# Patient Record
Sex: Male | Born: 1985 | Hispanic: No | Marital: Married | Smoking: Never smoker
Health system: Southern US, Community
[De-identification: ages and names within clinical notes are randomized; demographics above are authoritative.]

## PROBLEM LIST (undated history)

## (undated) DIAGNOSIS — G4730 Sleep apnea, unspecified: Secondary | ICD-10-CM

## (undated) DIAGNOSIS — Z720 Tobacco use: Secondary | ICD-10-CM

## (undated) HISTORY — DX: Sleep apnea, unspecified: G47.30

---

## 2015-11-24 ENCOUNTER — Observation Stay (HOSPITAL_COMMUNITY): Payer: BLUE CROSS/BLUE SHIELD

## 2015-11-24 ENCOUNTER — Observation Stay (HOSPITAL_COMMUNITY)
Admission: AD | Admit: 2015-11-24 | Discharge: 2015-11-26 | Disposition: A | Discharge status: Home / Self Care | Payer: BLUE CROSS/BLUE SHIELD | Source: Ambulatory Visit | Attending: Internal Medicine | Admitting: Internal Medicine

## 2015-11-24 ENCOUNTER — Encounter (HOSPITAL_COMMUNITY): Payer: Self-pay | Admitting: Internal Medicine

## 2015-11-24 DIAGNOSIS — R197 Diarrhea, unspecified: Secondary | ICD-10-CM | POA: Diagnosis not present

## 2015-11-24 DIAGNOSIS — F17290 Nicotine dependence, other tobacco product, uncomplicated: Secondary | ICD-10-CM | POA: Insufficient documentation

## 2015-11-24 DIAGNOSIS — E669 Obesity, unspecified: Secondary | ICD-10-CM | POA: Diagnosis not present

## 2015-11-24 DIAGNOSIS — R509 Fever, unspecified: Principal | ICD-10-CM | POA: Insufficient documentation

## 2015-11-24 DIAGNOSIS — K921 Melena: Secondary | ICD-10-CM | POA: Insufficient documentation

## 2015-11-24 DIAGNOSIS — R531 Weakness: Secondary | ICD-10-CM | POA: Insufficient documentation

## 2015-11-24 DIAGNOSIS — F17200 Nicotine dependence, unspecified, uncomplicated: Secondary | ICD-10-CM | POA: Diagnosis present

## 2015-11-24 DIAGNOSIS — A09 Infectious gastroenteritis and colitis, unspecified: Secondary | ICD-10-CM | POA: Diagnosis not present

## 2015-11-24 HISTORY — DX: Tobacco use: Z72.0

## 2015-11-24 LAB — COMPREHENSIVE METABOLIC PANEL
ALBUMIN: 3.5 g/dL (ref 3.5–5.0)
ALK PHOS: 62 U/L (ref 38–126)
ALT: 33 U/L (ref 17–63)
ANION GAP: 9 (ref 5–15)
AST: 29 U/L (ref 15–41)
BILIRUBIN TOTAL: 0.4 mg/dL (ref 0.3–1.2)
BUN: 9 mg/dL (ref 6–20)
CALCIUM: 8.8 mg/dL — AB (ref 8.9–10.3)
CO2: 25 mmol/L (ref 22–32)
Chloride: 103 mmol/L (ref 101–111)
Creatinine, Ser: 1.13 mg/dL (ref 0.61–1.24)
GFR calc Af Amer: >60 mL/min (ref >60)
GLUCOSE: 100 mg/dL — AB (ref 65–99)
Potassium: 3.7 mmol/L (ref 3.5–5.1)
Sodium: 137 mmol/L (ref 135–145)
TOTAL PROTEIN: 7.8 g/dL (ref 6.5–8.1)

## 2015-11-24 LAB — URINALYSIS, ROUTINE W REFLEX MICROSCOPIC
Bilirubin Urine: NEGATIVE
GLUCOSE, UA: NEGATIVE mg/dL
KETONES UR: NEGATIVE mg/dL
LEUKOCYTES UA: NEGATIVE
Nitrite: NEGATIVE
PROTEIN: NEGATIVE mg/dL
Specific Gravity, Urine: 1.015 (ref 1.005–1.030)
pH: 5 (ref 5.0–8.0)

## 2015-11-24 LAB — CBC
HEMATOCRIT: 37.1 % — AB (ref 39.0–52.0)
HEMOGLOBIN: 12.1 g/dL — AB (ref 13.0–17.0)
MCH: 25.6 pg — ABNORMAL LOW (ref 26.0–34.0)
MCHC: 32.6 g/dL (ref 30.0–36.0)
MCV: 78.6 fL (ref 78.0–100.0)
Platelets: 268 10*3/uL (ref 150–400)
RBC: 4.72 MIL/uL (ref 4.22–5.81)
RDW: 13.9 % (ref 11.5–15.5)
WBC: 5.6 10*3/uL (ref 4.0–10.5)

## 2015-11-24 LAB — C-REACTIVE PROTEIN: CRP: 3.2 mg/dL — ABNORMAL HIGH (ref <1.0)

## 2015-11-24 LAB — C DIFFICILE QUICK SCREEN W PCR REFLEX
C Diff antigen: NEGATIVE
C Diff interpretation: NEGATIVE
C Diff toxin: NEGATIVE

## 2015-11-24 LAB — LACTIC ACID, PLASMA
LACTIC ACID, VENOUS: 1 mmol/L (ref 0.5–2.0)
Lactic Acid, Venous: 1.2 mmol/L (ref 0.5–2.0)

## 2015-11-24 LAB — TSH: TSH: 1.266 u[IU]/mL (ref 0.350–4.500)

## 2015-11-24 LAB — URINE MICROSCOPIC-ADD ON

## 2015-11-24 MED ORDER — ACETAMINOPHEN 325 MG PO TABS: 650.0000 mg | ORAL_TABLET | Freq: Four times a day (QID) | ORAL | Status: DC | PRN

## 2015-11-24 MED ORDER — TRAZODONE HCL 50 MG PO TABS: 25.0000 mg | ORAL_TABLET | Freq: Every evening | ORAL | Status: DC | PRN

## 2015-11-24 MED ORDER — HYDROCODONE-ACETAMINOPHEN 5-325 MG PO TABS: 1.0000 | ORAL_TABLET | ORAL | Status: DC | PRN

## 2015-11-24 MED ORDER — ONDANSETRON HCL 4 MG/2ML IJ SOLN: 4.0000 mg | Freq: Four times a day (QID) | INTRAMUSCULAR | Status: DC | PRN

## 2015-11-24 MED ORDER — SODIUM CHLORIDE 0.9 % IV SOLN
INTRAVENOUS | Status: DC
  Administered 2015-11-24 – 2015-11-25 (×2): via INTRAVENOUS

## 2015-11-24 MED ORDER — ACETAMINOPHEN 650 MG RE SUPP
650.0000 mg | Freq: Four times a day (QID) | RECTAL | Status: DC | PRN
Start: 2015-11-24 — End: 2015-11-26

## 2015-11-24 MED ORDER — ONDANSETRON HCL 4 MG PO TABS: 4.0000 mg | ORAL_TABLET | Freq: Four times a day (QID) | ORAL | Status: DC | PRN

## 2015-11-24 MED ORDER — BOOST / RESOURCE BREEZE PO LIQD
1.0000 | Freq: Three times a day (TID) | ORAL | Status: DC
  Administered 2015-11-24 – 2015-11-26 (×3): 1 via ORAL

## 2015-11-24 NOTE — H&P (Signed)
[facility] History and Physical  Larry BatmanBankim Consuegra ZOX:[id number] DOB: [birth year] DOA: 11/24/2015  Referring physician: Loreta Avemann PCP: Galvin ProfferHAGUE, IMRAN P, MD   Chief Complaint: persistent diarrhea/generalized weakness/fever  HPI: Larry Sanders is a very pleasant 30 y.o. male with no past medical history admitted to room 24 on [address] from gastroenterology office with chief complaint of persistent moderate loose stool with intermittent hematochezia, fever and generalized weakness.  Information is obtained from the patient reports 2 month history of persistent moderately stool. He also reports intermittent hematochezia. He denies abdominal pain nausea vomiting constipation. Over the last 2 days he developed sudden generalized weakness intermittent fever. He was referred to gastroenterology today and reportedly his temperature was 102.6 in the office. He was referred for admission. He denies headache dizziness syncope or near-syncope. He denies dysuria hematuria frequency or urgency. He denies NSAID use. He does report that he frequently has the sensation of needing to have a bowel movement and is unable to go. When he does have bowel movement is usually a small amount and very soft. Last 2 days he has had frequent watery brown stools.  Review of Systems:  Constitutional:  No weight loss, night sweats, Fevers, chills, fatigue.  HEENT:  No headaches, Difficulty swallowing,Tooth/dental problems,Sore throat,  No sneezing, itching, ear ache, nasal congestion, post nasal drip,  Cardio-vascular:  No chest pain, Orthopnea, PND, swelling in lower extremities, anasarca, dizziness, palpitations  GI:  No heartburn, indigestion, abdominal pain, nausea, vomiting, diarrhea, change in bowel habits, loss of appetite  Resp:  No shortness of breath with exertion or at rest. No excess mucus, no productive cough, No non-productive cough, No coughing up of blood.No change in color of mucus.No wheezing.No chest wall deformity    Skin:  no rash or lesions.  GU:  no dysuria, change in color of urine, no urgency or frequency. No flank pain.  Musculoskeletal:  No joint pain or swelling. No decreased range of motion. No back pain.  Psych:  No change in mood or affect. No depression or anxiety. No memory loss.   Past Medical History  Diagnosis Date  . Tobacco use     dips   No past surgical history on file. Social History:  has no tobacco, alcohol, and drug history on file.  Not on File  No family history on file. father still alive father with a history of high blood pressure. He has siblings who are in good health  Prior to Admission medications   Medication Sig Start Date End Date Taking? Authorizing Provider  Eluxadoline (VIBERZI) 100 MG TABS Take 100 mg by mouth daily.   Yes Historical Provider, MD   Physical Exam: Filed Vitals:   11/24/15 1433  BP: 139/82  Pulse: 101  Temp: 98.6 F (37 C)  TempSrc: Oral  Resp: 18  Height: 5\' 7"  (1.702 m)  Weight: 133.811 kg (295 lb)  SpO2: 99%    Wt Readings from Last 3 Encounters:  11/24/15 133.811 kg (295 lb)    General:  Appears calm and comfortable, obese Eyes: PERRL, normal lids, irises & conjunctiva ENT: grossly normal hearing, mucous membranes of his mouth are moist and pink Neck: no LAD, masses or thyromegaly Cardiovascular: RRR, no m/r/g. No LE edema. Pedal pulses present and palpable  Respiratory: CTA bilaterally, no w/r/r. Normal respiratory effort. Abdomen: soft, ntnd, obese positive bowel sounds but somewhat sluggish no guarding or rebounding Skin: no rash or induration seen on limited exam Musculoskeletal: grossly normal tone BUE/BLE Psychiatric: grossly normal mood  and affect, speech fluent and appropriate Neurologic: grossly non-focal. Speech clear facial symmetry           Labs on Admission:  Basic Metabolic Panel: No results for input(s): NA, K, CL, CO2, GLUCOSE, BUN, CREATININE, CALCIUM, MG, PHOS in the last 168 hours. Liver  Function Tests: No results for input(s): AST, ALT, ALKPHOS, BILITOT, PROT, ALBUMIN in the last 168 hours. No results for input(s): LIPASE, AMYLASE in the last 168 hours. No results for input(s): AMMONIA in the last 168 hours. CBC: No results for input(s): WBC, NEUTROABS, HGB, HCT, MCV, PLT in the last 168 hours. Cardiac Enzymes: No results for input(s): CKTOTAL, CKMB, CKMBINDEX, TROPONINI in the last 168 hours.  BNP (last 3 results) No results for input(s): BNP in the last 8760 hours.  ProBNP (last 3 results) No results for input(s): PROBNP in the last 8760 hours.  CBG: No results for input(s): GLUCAP in the last 168 hours.  Radiological Exams on Admission: No results found.  EKG:   Assessment/Plan Principal Problem:   Fever, unspecified Active Problems:   Diarrhea   Hematochezia   Generalized weakness   Obesity   Tobacco use disorder   Fever  #1. Fever. Etiology unclear. Of note patient had recent blood work and stool studies done that he reports were "negative" -Admit to MedSurg -Obtain urinalysis chest x-ray -Stool studies -CBC -  #2. Persistent/worsening diarrhea. Etiology unclear. -Stool studies -KUB -GI consult. -FOBT  3. Generalized weakness. May be related to above. -Obtain complete metabolic panel -CBC -IV fluids  #4. Tobacco use. He uses snuff -Cessation counseling offered  #5. Obesity. BMI greater than 30. -Nutritional consult   Reportedly Dr Loreta Ave will see Code Status: full DVT Prophylaxis: Family Communication: wife at bedside Disposition Plan: home when ready  Time spent: 29  [facility] M [facility]

## 2015-11-24 NOTE — Progress Notes (Signed)
Pt admitted directly with c/o loose stools, abdo pain, poor appetite and generalized weakness. Reports that he has not eaten any meal in 3 days. Stool specimen ordered and still to be collected. Pt is alert, oriented and able to voice needs. No pain voiced at this time

## 2015-11-24 NOTE — Consult Note (Signed)
Reason for Consult: Diarrhea, fever Referring Physician: [facility]  Brandol Celine MansDesai HPI: This is a 30 year old male with a 3 month history of 20-25 bloody bowel movements with mucus.  Over the time course his symptoms have worsened and over the past three days he has watery bloody diarrhea.  No complaints of abdominal pain.  Initially he presented to [facility] under an urgent care setting.  At that time he was just having mucus.  A week or so later he started having hematochezia with his mucus and he represented back to [facility] and, per his report, further work up was performed with stool studies and blood work.  The results were normal and he was encouraged to seek a PCP.  He [place]. [place]ran [place] as he is his parents PCP and his first appointment was 3 weeks ago.  He was started on Viberzi, a medication for IBS, but it was not beneficial.  In fact he felt that it may him worse.  Additionally, he was treated with doxycycline from [facility] for his complaints, but it was not of any benefit.  There is no history of IBD in the family and he denies any prior issues before this time.  The patient started having fever up to 102 a couple of days ago.  Past Medical History  Diagnosis Date  . Tobacco use     dips    No past surgical history on file.  No family history on file.  Social History:  has no tobacco, alcohol, and drug history on file.  Allergies: Not on File  Medications:  Scheduled: . feeding supplement  1 Container Oral TID BM   Continuous: . sodium chloride      No results found for this or any previous visit (from the past 24 hour(s)).   No results found.  ROS:  As stated above in the HPI otherwise negative.  Blood pressure 139/82, pulse 101, temperature 98.6 F (37 C), temperature source Oral, resp. rate 18, height 5\' 7"  (1.702 m), weight 133.811 kg (295 lb), SpO2 99 %.    PE: Gen: NAD, Alert and Oriented HEENT:  [place]/AT, EOMI Neck:  Supple, no LAD Lungs: CTA Bilaterally CV: RRR without M/G/R ABM: Soft, NTND, obese, +BS Ext: No C/C/E  Assessment/Plan: 1) Hematochezia. 2) Diarrhea.   Given the severity of his symptoms he appears to be surprisingly well.  No complaints of any abdominal pain.  I spoke with Dr. Loreta AveMann who saw him in the office today and recommended a hospital admission.  She reports that no work up was performed.  My suspicion is that he has IBD, but an infectious etiology needs to be evaluated.    Plan: 1) IV hydration. 2) Check for C. Diff. 3) Basic blood work. 4) Pending the results of the C. Diff work up a colonoscopy may or may need be performed.  Othell Jaime D 11/24/2015, 4:36 PM

## 2015-11-25 DIAGNOSIS — E669 Obesity, unspecified: Secondary | ICD-10-CM

## 2015-11-25 DIAGNOSIS — R531 Weakness: Secondary | ICD-10-CM | POA: Diagnosis not present

## 2015-11-25 DIAGNOSIS — K921 Melena: Secondary | ICD-10-CM

## 2015-11-25 DIAGNOSIS — A09 Infectious gastroenteritis and colitis, unspecified: Secondary | ICD-10-CM

## 2015-11-25 DIAGNOSIS — R509 Fever, unspecified: Secondary | ICD-10-CM | POA: Diagnosis not present

## 2015-11-25 LAB — CBC
HEMATOCRIT: 39.1 % (ref 39.0–52.0)
Hemoglobin: 12.4 g/dL — ABNORMAL LOW (ref 13.0–17.0)
MCH: 24.8 pg — ABNORMAL LOW (ref 26.0–34.0)
MCHC: 31.7 g/dL (ref 30.0–36.0)
MCV: 78.4 fL (ref 78.0–100.0)
PLATELETS: 260 10*3/uL (ref 150–400)
RBC: 4.99 MIL/uL (ref 4.22–5.81)
RDW: 14 % (ref 11.5–15.5)
WBC: 5.3 10*3/uL (ref 4.0–10.5)

## 2015-11-25 LAB — GASTROINTESTINAL PANEL BY PCR, STOOL (REPLACES STOOL CULTURE)
ASTROVIRUS: NOT DETECTED
Adenovirus F40/41: NOT DETECTED
CAMPYLOBACTER SPECIES: NOT DETECTED
Cryptosporidium: NOT DETECTED
Cyclospora cayetanensis: NOT DETECTED
E. COLI O157: NOT DETECTED
ENTEROTOXIGENIC E COLI (ETEC): NOT DETECTED
Entamoeba histolytica: NOT DETECTED
Enteroaggregative E coli (EAEC): DETECTED — AB
Enteropathogenic E coli (EPEC): NOT DETECTED
Giardia lamblia: NOT DETECTED
NOROVIRUS GI/GII: NOT DETECTED
PLESIMONAS SHIGELLOIDES: NOT DETECTED
Rotavirus A: NOT DETECTED
SALMONELLA SPECIES: NOT DETECTED
SAPOVIRUS (I, II, IV, AND V): NOT DETECTED
SHIGA LIKE TOXIN PRODUCING E COLI (STEC): NOT DETECTED
SHIGELLA/ENTEROINVASIVE E COLI (EIEC): NOT DETECTED
Vibrio cholerae: NOT DETECTED
Vibrio species: NOT DETECTED
Yersinia enterocolitica: NOT DETECTED

## 2015-11-25 LAB — BASIC METABOLIC PANEL
Anion gap: 10 (ref 5–15)
BUN: 7 mg/dL (ref 6–20)
CHLORIDE: 104 mmol/L (ref 101–111)
CO2: 23 mmol/L (ref 22–32)
CREATININE: 1.02 mg/dL (ref 0.61–1.24)
Calcium: 8.5 mg/dL — ABNORMAL LOW (ref 8.9–10.3)
GFR calc Af Amer: >60 mL/min (ref >60)
GLUCOSE: 103 mg/dL — AB (ref 65–99)
POTASSIUM: 3.3 mmol/L — AB (ref 3.5–5.1)
SODIUM: 137 mmol/L (ref 135–145)

## 2015-11-25 MED ORDER — POTASSIUM CHLORIDE CRYS ER 20 MEQ PO TBCR
40.0000 meq | EXTENDED_RELEASE_TABLET | Freq: Once | ORAL | Status: AC
  Administered 2015-11-25: 40 meq via ORAL
  Filled 2015-11-25: qty 2

## 2015-11-25 MED ORDER — CIPROFLOXACIN HCL 500 MG PO TABS
500.0000 mg | ORAL_TABLET | Freq: Two times a day (BID) | ORAL | Status: DC
  Administered 2015-11-25 – 2015-11-26 (×2): 500 mg via ORAL
  Filled 2015-11-25 (×2): qty 1

## 2015-11-25 NOTE — Progress Notes (Addendum)
PROGRESS NOTE  Larry Sanders [id number] DOB: 1986-06-25 DOA: 11/24/2015 PCP: Galvin ProfferHAGUE, IMRAN P, MD  Assessment/Plan: Fever. Etiology unclear. Of note patient had recent blood work and stool studies done that he reports were "negative" -Admit to MedSurg -Stool studies pending -no WBCs   Persistent/worsening diarrhea. Etiology unclear. -Stool studies- show EAEC-- will plan to treat for 5 days of PO cipro -GI consult as they sent in from office  -HIV check  Generalized weakness. May be related to above. -Obtain complete metabolic panel -CBC -IV fluids  Tobacco use. He uses snuff -Cessation counseling offered  Obesity. BMI greater than 30. -Nutritional consult  Code Status: full Family Communication: patient Disposition Plan:    Consultants:  GI  Procedures:      HPI/Subjective: Still having diarrhea  Objective: Filed Vitals:   11/24/15 2225 11/25/15 0523  BP: 120/64 125/81  Pulse: 98 79  Temp: 99.4 F (37.4 C) 98 F (36.7 C)  Resp: 18 18   No intake or output data in the 24 hours ending 11/25/15 0827 Filed Weights   11/24/15 1433  Weight: 133.811 kg (295 lb)    Exam:   General:  Awake, NAD  Cardiovascular: rrr  Respiratory: clear  Abdomen: +BS, soft  Musculoskeletal: no edema   Data Reviewed: Basic Metabolic Panel:  Recent Labs Lab 11/24/15 1550 11/25/15 0341  NA 137 137  K 3.7 3.3*  CL 103 104  CO2 25 23  GLUCOSE 100* 103*  BUN 9 7  CREATININE 1.13 1.02  CALCIUM 8.8* 8.5*   Liver Function Tests:  Recent Labs Lab 11/24/15 1550  AST 29  ALT 33  ALKPHOS 62  BILITOT 0.4  PROT 7.8  ALBUMIN 3.5   No results for input(s): LIPASE, AMYLASE in the last 168 hours. No results for input(s): AMMONIA in the last 168 hours. CBC:  Recent Labs Lab 11/24/15 1550 11/25/15 0341  WBC 5.6 5.3  HGB 12.1* 12.4*  HCT 37.1* 39.1  MCV 78.6 78.4  PLT 268 260   Cardiac Enzymes: No results for input(s): CKTOTAL, CKMB, CKMBINDEX,  TROPONINI in the last 168 hours. BNP (last 3 results) No results for input(s): BNP in the last 8760 hours.  ProBNP (last 3 results) No results for input(s): PROBNP in the last 8760 hours.  CBG: No results for input(s): GLUCAP in the last 168 hours.  Recent Results (from the past 240 hour(s))  C difficile quick scan w PCR reflex     Status: None   Collection Time: 11/24/15  3:07 PM  Result Value Ref Range Status   C Diff antigen NEGATIVE NEGATIVE Final   C Diff toxin NEGATIVE NEGATIVE Final   C Diff interpretation Negative for toxigenic C. difficile  Final     Studies: Dg Chest 2 View  11/24/2015  CLINICAL DATA:  30 year old male with a history of fever EXAM: CHEST - 2 VIEW COMPARISON:  None. FINDINGS: Cardiomediastinal silhouette within normal limits. Lung volumes are low. Proud interstitial markings in vasculature bilaterally. No confluent airspace disease, pneumothorax, or pleural effusion. No displaced fracture. Unremarkable appearance of the upper abdomen. IMPRESSION: Low lung volumes with no evidence of lobar pneumonia. Signed, Yvone NeuJaime S. Loreta AveWagner, DO [facility] Electronically Signed   By: Gilmer MorJaime  Wagner D.O.   On: 11/24/2015 18:16   Dg Abd 1 View  11/24/2015  CLINICAL DATA:  30 year old male with a history of fever EXAM: ABDOMEN - 1 VIEW COMPARISON:  None. FINDINGS: Gas within stomach, small bowel, and colon.  Upright image demonstrates air-fluid levels of the right and left abdomen. No abnormally distended small bowel or colon. No radiopaque foreign body. No unexpected calcifications. No displaced fracture. IMPRESSION: Multiple air-fluid levels on the upright image, without evidence of obstruction. Findings are nonspecific though potentially could represent enteritis. Signed, Yvone Neu. Loreta Ave, DO [facility] Electronically Signed   By: Gilmer Mor D.O.   On: 11/24/2015 18:17      Scheduled Meds: . feeding supplement  1 Container Oral TID BM   Continuous Infusions: . sodium chloride 50 mL/hr at 11/24/15 1815   Antibiotics Given (last 72 hours)    None      Principal Problem:   Fever, unspecified Active Problems:   Diarrhea   Hematochezia   Generalized weakness   Obesity   Tobacco use disorder   Fever    Time spent: 35 min    Malyiah Fellows [facility]  [facility] Pager [phone]. If 7PM-7AM, please contact night-coverage at [email or web address], password [facility] 11/25/2015, 8:27 AM

## 2015-11-25 NOTE — Progress Notes (Signed)
Subjective: Since I last evaluated the patient, he seems to be doing much better. He has had 4 small volume BM's today. He denies having any abdominal pain, nausea or vomiting. Anxious about going home. 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     Objective: Vital signs in last 24 hours: Temp:  [98 F (36.7 C)-99.4 F (37.4 C)] 98.6 F (37 C) (01/04 1447) Pulse Rate:  [79-98] 79 (01/04 1447) Resp:  [16-18] 16 (01/04 1447) BP: (120-131)/(64-81) 131/77 mmHg (01/04 1447) SpO2:  [96 %-100 %] 100 % (01/04 1447) Last BM Date: 11/24/15   General appearance: alert, cooperative, appears older than stated age and no distress Resp: clear to auscultation bilaterally Cardio: regular rate and rhythm, S1, S2 normal, no murmur, click, rub or gallop GI: soft, non-tender; bowel sounds normal; no masses,  no organomegaly Extremities: extremities normal, atraumatic, no cyanosis or edema  Lab Results:  Recent Labs  11/24/15 1550 11/25/15 0341  WBC 5.6 5.3  HGB 12.1* 12.4*  HCT 37.1* 39.1  PLT 268 260   BMET  Recent Labs  11/24/15 1550 11/25/15 0341  NA 137 137  K 3.7 3.3*  CL 103 104  CO2 25 23  GLUCOSE 100* 103*  BUN 9 7  CREATININE 1.13 1.02  CALCIUM 8.8* 8.5*   LFT  Recent Labs  11/24/15 1550  PROT 7.8  ALBUMIN 3.5  AST 29  ALT 33  ALKPHOS 62  BILITOT 0.4    Recent Labs  11/24/15 1507  CDIFFTOX NEGATIVE   Studies/Results: Dg Chest 2 View  11/24/2015  CLINICAL DATA:  30 year old male with a history of fever EXAM: CHEST - 2 VIEW COMPARISON:  None. FINDINGS: Cardiomediastinal silhouette within normal limits. Lung volumes are low. Proud interstitial markings in vasculature bilaterally. No confluent airspace disease, pneumothorax, or pleural effusion. No displaced fracture. Unremarkable appearance of the upper abdomen. IMPRESSION: Low lung volumes with no evidence of lobar pneumonia. Signed, Yvone NeuJaime S. Loreta AveWagner, DO [facility] Electronically Signed   By: Gilmer MorJaime  Wagner D.O.   On: 11/24/2015 18:16   Dg Abd 1 View  11/24/2015  CLINICAL DATA:  30 year old male with a history of fever EXAM: ABDOMEN - 1 VIEW COMPARISON:  None. FINDINGS: Gas within stomach, small bowel, and colon. Upright image demonstrates air-fluid levels of the right and left abdomen. No abnormally distended small bowel or colon. No radiopaque foreign body. No unexpected calcifications. No displaced fracture. IMPRESSION: Multiple air-fluid levels on the upright image, without evidence of obstruction. Findings are nonspecific though potentially could represent enteritis. Signed, Yvone NeuJaime S. Loreta AveWagner, DO [facility] Electronically Signed   By: Gilmer MorJaime  Wagner D.O.   On: 11/24/2015 18:17   Medications: I have reviewed the patient's current medications.  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 Continue present care. Advance diet as tolerated.    Onesty Clair 11/25/2015, 6:02 PM

## 2015-11-25 NOTE — Progress Notes (Signed)
Initial Nutrition Assessment  DOCUMENTATION CODES:   Morbid obesity  INTERVENTION:   -RD will follow for diet advancement and supplement diet as appropriate  NUTRITION DIAGNOSIS:   Inadequate oral intake related to inability to eat as evidenced by NPO status.  GOAL:   Patient will meet greater than or equal to 90% of their needs  MONITOR:   Diet advancement, Labs, Weight trends, Skin, I & O's  REASON FOR ASSESSMENT:   Malnutrition Screening Tool    ASSESSMENT:   Larry Sanders is a 30 y.o. male with a Past Medical History of chewing tobacco who presents with abd pain from PCP as direct admission  Pt admitted with hematochezia.   Spoke with RN, who reports pt was consuming water earlier daily without difficulty. She confirmed NPO status.  Hx obtained from pt, who was sitting in the recliner at time of visit. He reports he did not consume anything for the past 3 days due to diarrhea and abdominal pain. Prior to this, he reports that his appetite has been fair over the past 3 months, however, was not at baseline. PTA, pt generally consumed 1 large meal daily, consisting of chicken and rice.   Pt reports UBW is around 305# and endorses a 10# wt loss over the past 2 months (weight change not significant for time frame). 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 He reports he required to use the restroom 20-25 times daily on average.   Nutrition-Focused physical exam completed. Findings are no fat depletion, no muscle depletion, and no edema.   Pt understanding of NPO order and reports he does not feel like eating currently.  Labs reviewed: K: 3.3.   Diet Order:  Diet NPO time specified  Skin:  Reviewed, no issues  Last BM:  11/24/15  Height:   Ht Readings from Last 1 Encounters:  11/24/15 5\' 7"  (1.702 m)    Weight:   Wt Readings from Last 1 Encounters:  11/24/15 295 lb (133.811  kg)    Ideal Body Weight:  67.2 kg  BMI:  Body mass index is 46.19 kg/(m^2).  Estimated Nutritional Needs:   Kcal:  1900-2100  Protein:  95-105 grams  Fluid:  1.9-2.1 L  EDUCATION NEEDS:   No education needs identified at this time  Hennie Gosa A. Mayford KnifeWilliams, RD, LDN, CDE Pager: [phone] After hours Pager: [phone]

## 2015-11-25 NOTE — Progress Notes (Signed)
CRITICAL VALUE ALERT  Critical value received:  Stool specimen positive for EAEC(e.coli)  Date of notification:  11/25/15  Time of notification:  1445  Critical value read back:Yes.    Nurse who received alert:  Leota Jacobsenara Leeland Lovelady  MD notified (1st page):  Verner MouldVann, J  Time of first page:  1515  MD notified (2nd page):  Time of second page:  Responding MD:  Benjamine MolaVann. J  Time MD responded:  1516

## 2015-11-26 DIAGNOSIS — K921 Melena: Secondary | ICD-10-CM | POA: Diagnosis not present

## 2015-11-26 DIAGNOSIS — R531 Weakness: Secondary | ICD-10-CM | POA: Diagnosis not present

## 2015-11-26 DIAGNOSIS — A09 Infectious gastroenteritis and colitis, unspecified: Secondary | ICD-10-CM | POA: Diagnosis not present

## 2015-11-26 DIAGNOSIS — R509 Fever, unspecified: Secondary | ICD-10-CM | POA: Diagnosis not present

## 2015-11-26 LAB — HIV ANTIBODY (ROUTINE TESTING W REFLEX): HIV Screen 4th Generation wRfx: NONREACTIVE

## 2015-11-26 MED ORDER — CIPROFLOXACIN HCL 500 MG PO TABS: 500.0000 mg | ORAL_TABLET | Freq: Two times a day (BID) | ORAL | Status: DC

## 2015-11-26 NOTE — Progress Notes (Addendum)
According to the patient, he had 9 episodes of small loose green stools with the last one seen by this nurse a few minutes ago.  Patient denies any pain and had drink apple juice, water and veggie broth during my shift.

## 2015-11-26 NOTE — Progress Notes (Signed)
Subjective: 9 watery bowel movements.  No significant change.  Objective: Vital signs in last 24 hours: Temp:  [98.3 F (36.8 C)-98.6 F (37 C)] 98.6 F (37 C) (01/05 0542) Pulse Rate:  [79-86] 84 (01/05 0542) Resp:  [16-17] 17 (01/05 0542) BP: (104-131)/(58-77) 111/66 mmHg (01/05 0542) SpO2:  [100 %] 100 % (01/05 0542) Last BM Date: 11/26/15  Intake/Output from previous day: 01/04 0701 - 01/05 0700 In: 1727 [P.O.:1077; I.V.:650] Out: -  Intake/Output this shift:    General appearance: alert and no distress GI: soft, non-tender; bowel sounds normal; no masses,  no organomegaly  Lab Results:  Recent Labs  11/24/15 1550 11/25/15 0341  WBC 5.6 5.3  HGB 12.1* 12.4*  HCT 37.1* 39.1  PLT 268 260   BMET  Recent Labs  11/24/15 1550 11/25/15 0341  NA 137 137  K 3.7 3.3*  CL 103 104  CO2 25 23  GLUCOSE 100* 103*  BUN 9 7  CREATININE 1.13 1.02  CALCIUM 8.8* 8.5*   LFT  Recent Labs  11/24/15 1550  PROT 7.8  ALBUMIN 3.5  AST 29  ALT 33  ALKPHOS 62  BILITOT 0.4   PT/INR No results for input(s): LABPROT, INR in the last 72 hours. Hepatitis Panel No results for input(s): HEPBSAG, HCVAB, HEPAIGM, HEPBIGM in the last 72 hours. C-Diff  Recent Labs  11/24/15 1507  CDIFFTOX NEGATIVE   Fecal Lactopherrin No results for input(s): FECLLACTOFRN in the last 72 hours.  Studies/Results: Dg Chest 2 View  11/24/2015  CLINICAL DATA:  30 year old male with a history of fever EXAM: CHEST - 2 VIEW COMPARISON:  None. FINDINGS: Cardiomediastinal silhouette within normal limits. Lung volumes are low. Proud interstitial markings in vasculature bilaterally. No confluent airspace disease, pneumothorax, or pleural effusion. No displaced fracture. Unremarkable appearance of the upper abdomen. IMPRESSION: Low lung volumes with no evidence of lobar pneumonia. Signed, Yvone NeuJaime S. Loreta AveWagner, DO [facility] Electronically Signed    By: Gilmer MorJaime  Wagner D.O.   On: 11/24/2015 18:16   Dg Abd 1 View  11/24/2015  CLINICAL DATA:  30 year old male with a history of fever EXAM: ABDOMEN - 1 VIEW COMPARISON:  None. FINDINGS: Gas within stomach, small bowel, and colon. Upright image demonstrates air-fluid levels of the right and left abdomen. No abnormally distended small bowel or colon. No radiopaque foreign body. No unexpected calcifications. No displaced fracture. IMPRESSION: Multiple air-fluid levels on the upright image, without evidence of obstruction. Findings are nonspecific though potentially could represent enteritis. Signed, Yvone NeuJaime S. Loreta AveWagner, DO [facility] Electronically Signed   By: Gilmer MorJaime  Wagner D.O.   On: 11/24/2015 18:17    Medications:  Scheduled: . ciprofloxacin  500 mg Oral BID  . feeding supplement  1 Container Oral TID BM   Continuous: . sodium chloride 50 mL/hr at 11/25/15 1529    Assessment/Plan: 1) EAEC. 2) Diarrhea.   The patiently received one dose of Cipro.  Too soon to tell about the benefit.  Still with significant diarrhea.  Plan: 1) Continue with Cipro. 2) Continue with IV hydration.     Ezri Landers D 11/26/2015, 8:52 AM

## 2015-11-26 NOTE — Discharge Summary (Signed)
Physician Discharge Summary  [name] ZOX:[id number] DOB: 25-Nov-1985 DOA: 11/24/2015  PCP: Galvin Proffer, MD  Admit date: 11/24/2015 Discharge date: 11/26/2015  Time spent: 35 minutes  Recommendations for Outpatient Follow-up:  1. Return to GI- Dr. Loreta Ave if diarrhea persists    Discharge Diagnoses:  Principal Problem:   Fever, unspecified Active Problems:   Diarrhea   Hematochezia   Generalized weakness   Obesity   Tobacco use disorder   Fever   Discharge Condition: improved  Diet recommendation: as tolerated  Filed Weights   11/24/15 1433  Weight: 133.811 kg (295 lb)    History of present illness:  Larry Sanders is a very pleasant 30 y.o. male with no past medical history admitted to room 24 on 20 N. from gastroenterology office with chief complaint of persistent moderate loose stool with intermittent hematochezia, fever and generalized weakness.  Information is obtained from the patient reports 2 month history of persistent moderately stool. He also reports intermittent hematochezia. He denies abdominal pain nausea vomiting constipation. Over the last 2 days he developed sudden generalized weakness intermittent fever. He was referred to gastroenterology today and reportedly his temperature was 102.6 in the office. He was referred for admission. He denies headache dizziness syncope or near-syncope. He denies dysuria hematuria frequency or urgency. He denies NSAID use. He does report that he frequently has the sensation of needing to have a bowel movement and is unable to go. When he does have bowel movement is usually a small amount and very soft. Last 2 days he has had frequent watery brown stools.  Hospital Course:  EAEC causing diarrhea--- much improved per nursing (2 BMs in 7 hours), tolerating diet cipro BID x 5 days   Procedures:    Consultations:  GI  Discharge Exam: Filed Vitals:   11/25/15 2131 11/26/15 0542  BP: 104/58 111/66  Pulse: 86 84  Temp: 98.3 F  (36.8 C) 98.6 F (37 C)  Resp: 16 17    General:awake, much improved in weakness   Discharge Instructions   Discharge Instructions    Discharge instructions    Complete by:  As directed   Increase PO Water/gatorade intake Diet as tolerated     Increase activity slowly    Complete by:  As directed           Current Discharge Medication List    START taking these medications   Details  ciprofloxacin (CIPRO) 500 MG tablet Take 1 tablet (500 mg total) by mouth 2 (two) times daily. Qty: 8 tablet, Refills: 0      STOP taking these medications     Eluxadoline (VIBERZI) 100 MG TABS        Not on File Follow-up Information    Follow up with HAGUE, Myrene Galas, MD In 1 week.   Specialty:  Internal Medicine   Contact information:   [address] [place] [address] [phone]       Follow up with Charna Elizabeth, MD.   Specialty:  Gastroenterology   Why:  if diarrhea does not improve   Contact information:   [address], [place] [address] [phone]        The results of significant diagnostics from this hospitalization (including imaging, microbiology, ancillary and laboratory) are listed below for reference.    Significant Diagnostic Studies: Dg Chest 2 View  11/24/2015  CLINICAL DATA:  30 year old male with a history of fever EXAM: CHEST - 2 VIEW COMPARISON:  None. FINDINGS: Cardiomediastinal  silhouette within normal limits. Lung volumes are low. Proud interstitial markings in vasculature bilaterally. No confluent airspace disease, pneumothorax, or pleural effusion. No displaced fracture. Unremarkable appearance of the upper abdomen. IMPRESSION: Low lung volumes with no evidence of lobar pneumonia. Signed, Yvone Neu. Loreta Ave, DO [facility] Radiology Electronically Signed   By: Gilmer Mor D.O.   On: 11/24/2015 18:16   Dg Abd 1 View  11/24/2015  CLINICAL DATA:  30 year old male with a  history of fever EXAM: ABDOMEN - 1 VIEW COMPARISON:  None. FINDINGS: Gas within stomach, small bowel, and colon. Upright image demonstrates air-fluid levels of the right and left abdomen. No abnormally distended small bowel or colon. No radiopaque foreign body. No unexpected calcifications. No displaced fracture. IMPRESSION: Multiple air-fluid levels on the upright image, without evidence of obstruction. Findings are nonspecific though potentially could represent enteritis. Signed, Yvone Neu. Loreta Ave, DO [facility] Radiology Electronically Signed   By: Gilmer Mor D.O.   On: 11/24/2015 18:17    Microbiology: Recent Results (from the past 240 hour(s))  C difficile quick scan w PCR reflex     Status: None   Collection Time: 11/24/15  3:07 PM  Result Value Ref Range Status   C Diff antigen NEGATIVE NEGATIVE Final   C Diff toxin NEGATIVE NEGATIVE Final   C Diff interpretation Negative for toxigenic C. difficile  Final  Gastrointestinal Panel by PCR , Stool     Status: Abnormal   Collection Time: 11/24/15  3:07 PM  Result Value Ref Range Status   Campylobacter species NOT DETECTED NOT DETECTED Final   Plesimonas shigelloides NOT DETECTED NOT DETECTED Final   Salmonella species NOT DETECTED NOT DETECTED Final   Yersinia enterocolitica NOT DETECTED NOT DETECTED Final   Vibrio species NOT DETECTED NOT DETECTED Final   Vibrio cholerae NOT DETECTED NOT DETECTED Final   Enteroaggregative E coli (EAEC) DETECTED (A) NOT DETECTED Final    Comment: CRITICAL RESULT CALLED TO, READ BACK BY AND VERIFIED WITH: TARA CROSS AT 1440 ON 11/25/15 CTJ    Enteropathogenic E coli (EPEC) NOT DETECTED NOT DETECTED Final   Enterotoxigenic E coli (ETEC) NOT DETECTED NOT DETECTED Final   Shiga like toxin producing E coli (STEC) NOT DETECTED NOT DETECTED Final   E. coli O157 NOT DETECTED NOT DETECTED Final   Shigella/Enteroinvasive E coli (EIEC) NOT DETECTED NOT DETECTED  Final   Cryptosporidium NOT DETECTED NOT DETECTED Final   Cyclospora cayetanensis NOT DETECTED NOT DETECTED Final   Entamoeba histolytica NOT DETECTED NOT DETECTED Final   Giardia lamblia NOT DETECTED NOT DETECTED Final   Adenovirus F40/41 NOT DETECTED NOT DETECTED Final   Astrovirus NOT DETECTED NOT DETECTED Final   Norovirus GI/GII NOT DETECTED NOT DETECTED Final   Rotavirus A NOT DETECTED NOT DETECTED Final   Sapovirus (I, II, IV, and V) NOT DETECTED NOT DETECTED Final     Labs: Basic Metabolic Panel:  Recent Labs Lab 11/24/15 1550 11/25/15 0341  NA 137 137  K 3.7 3.3*  CL 103 104  CO2 25 23  GLUCOSE 100* 103*  BUN 9 7  CREATININE 1.13 1.02  CALCIUM 8.8* 8.5*   Liver Function Tests:  Recent Labs Lab 11/24/15 1550  AST 29  ALT 33  ALKPHOS 62  BILITOT 0.4  PROT 7.8  ALBUMIN 3.5   No results for input(s): LIPASE, AMYLASE in the last 168 hours. No results for input(s): AMMONIA in the last 168 hours. CBC:  Recent Labs Lab 11/24/15 1550 11/25/15 0341  WBC 5.6 5.3  HGB 12.1* 12.4*  HCT 37.1* 39.1  MCV 78.6 78.4  PLT 268 260   Cardiac Enzymes: No results for input(s): CKTOTAL, CKMB, CKMBINDEX, TROPONINI in the last 168 hours. BNP: BNP (last 3 results) No results for input(s): BNP in the last 8760 hours.  ProBNP (last 3 results) No results for input(s): PROBNP in the last 8760 hours.  CBG: No results for input(s): GLUCAP in the last 168 hours.     Signed:  Joseph ArtJESSICA U Phong Isenberg DO  [facility] 11/26/2015, 12:33 PM

## 2015-11-26 NOTE — Progress Notes (Signed)
Patient discharged to home. Discharge instructions were reviewed with patient and a copy of the AVS was given to patient as well. Patient agreed and verbalized understanding. Patient waiting for spouse to pick to take him home. Patient agreed and verbalized understanding. No further questions at this moment.  Jesusita OkaLa Hoz, Josi Roediger n 11/26/2015 1:06 PM

## 2017-02-24 IMAGING — DX DG CHEST 2V
2 series · 2 of 2 positions shown · non-contrast
Comparison: None.

CLINICAL DATA: 29-year-old male with a history of fever

EXAM:
CHEST - 2 VIEW

[w chest pa]
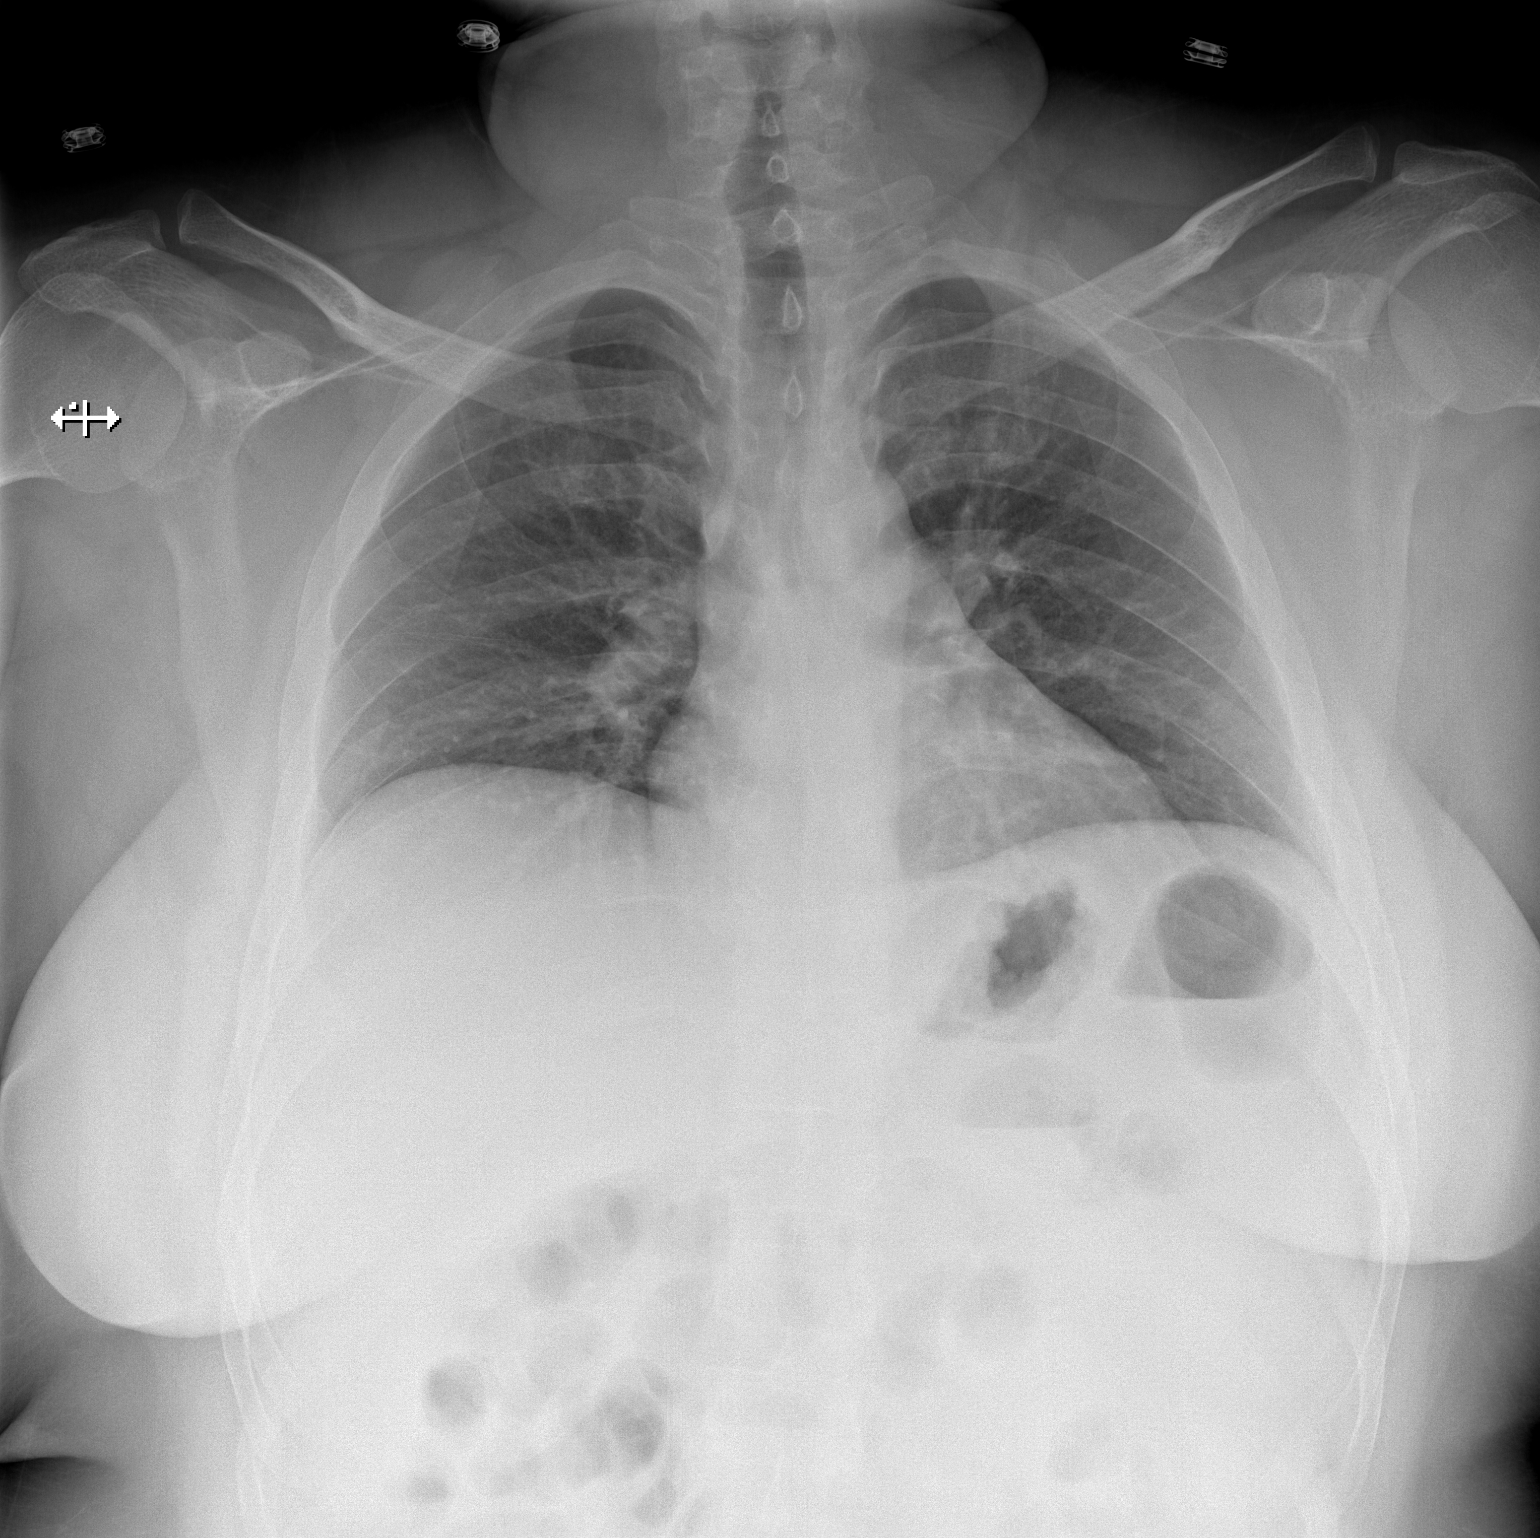

[w chest lat]
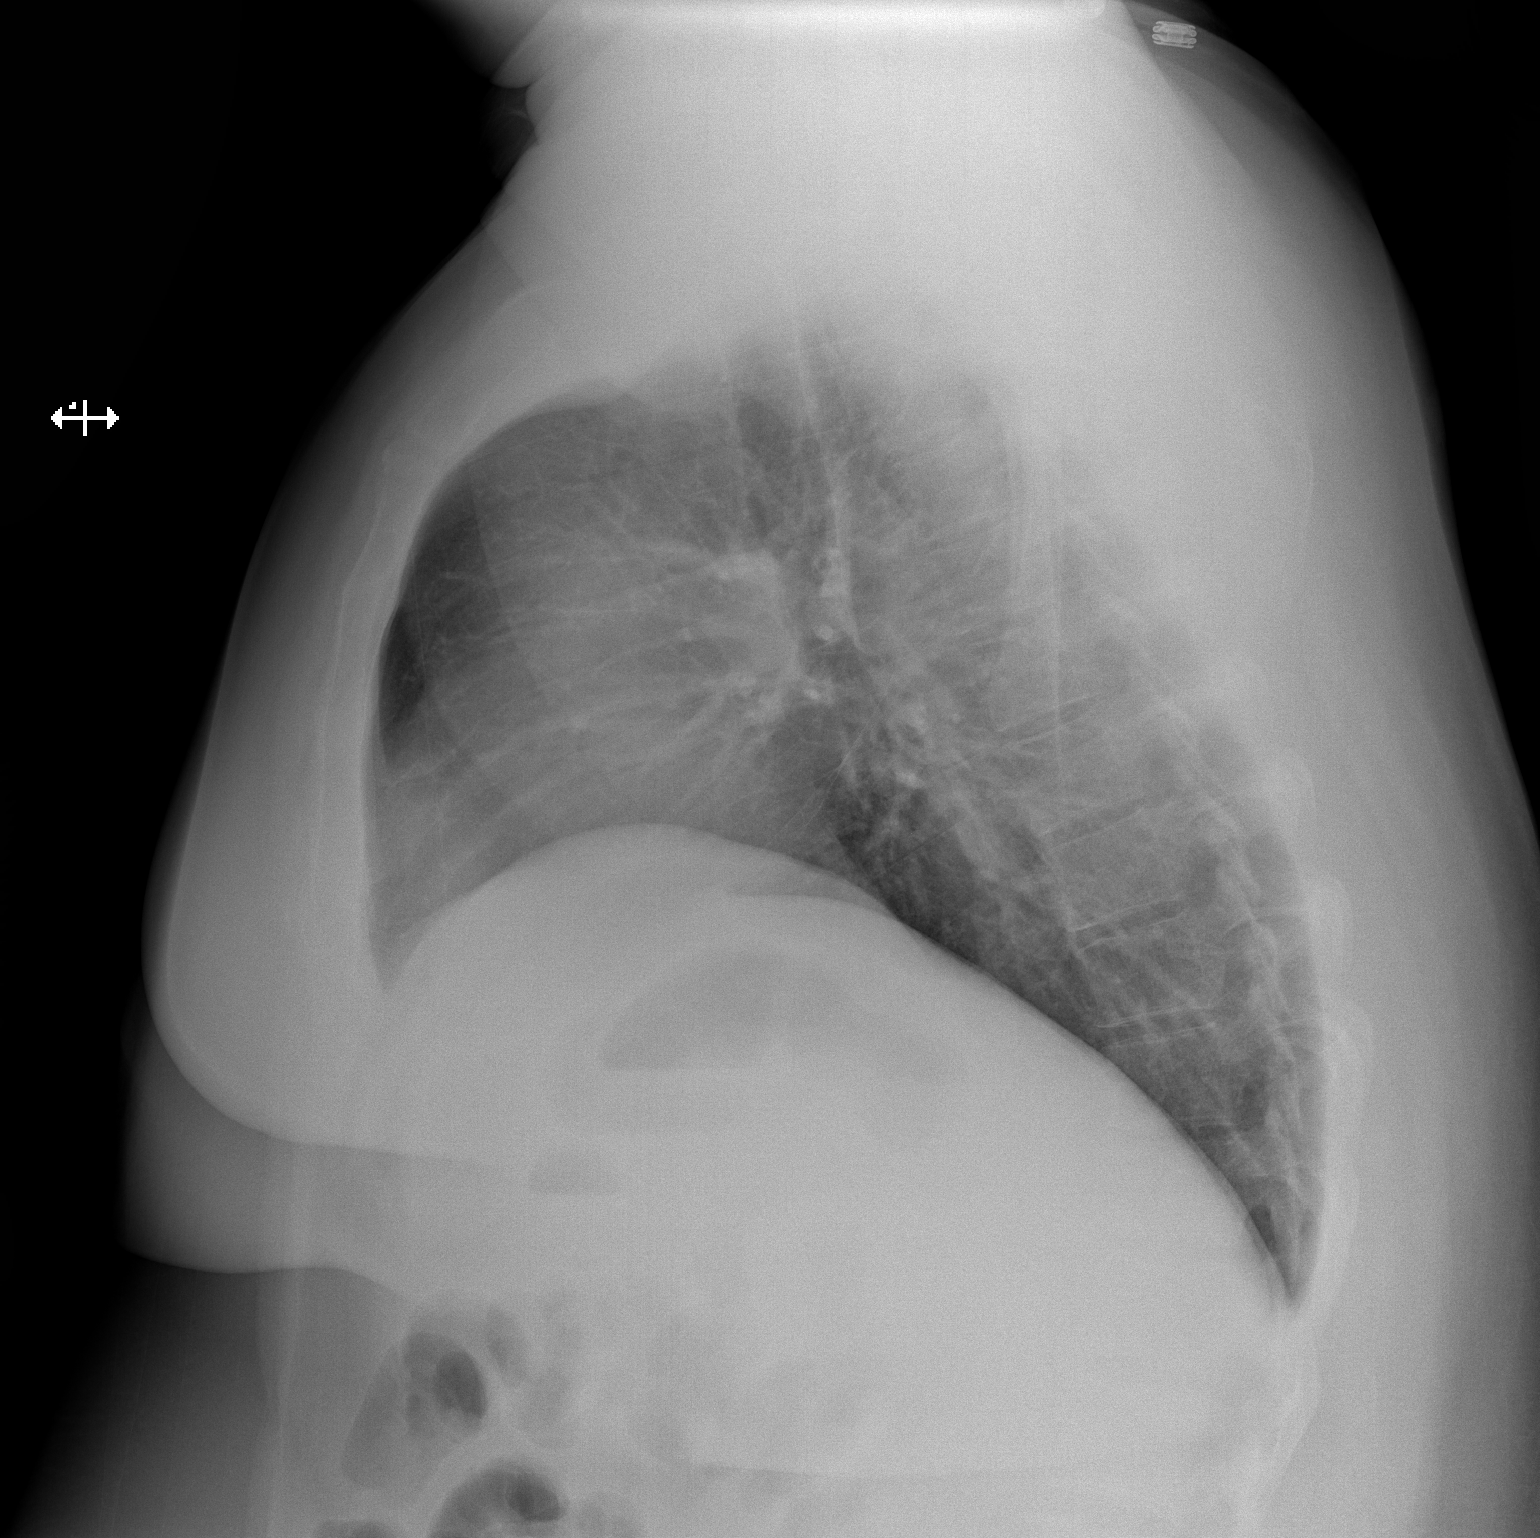

[2 of 2 positions shown; findings below may reference images not displayed]

FINDINGS: Cardiomediastinal silhouette within normal limits.

Lung volumes are low. Proud interstitial markings in vasculature
bilaterally.

No confluent airspace disease, pneumothorax, or pleural effusion.

No displaced fracture.

Unremarkable appearance of the upper abdomen.
IMPRESSION: Low lung volumes with no evidence of lobar pneumonia.

## 2017-02-24 IMAGING — DX DG ABDOMEN 1V
2 series · 2 of 2 positions shown · non-contrast
Comparison: None.

CLINICAL DATA: 29-year-old male with a history of fever

EXAM:
ABDOMEN - 1 VIEW

[w abdomen upright (1 of 2)]
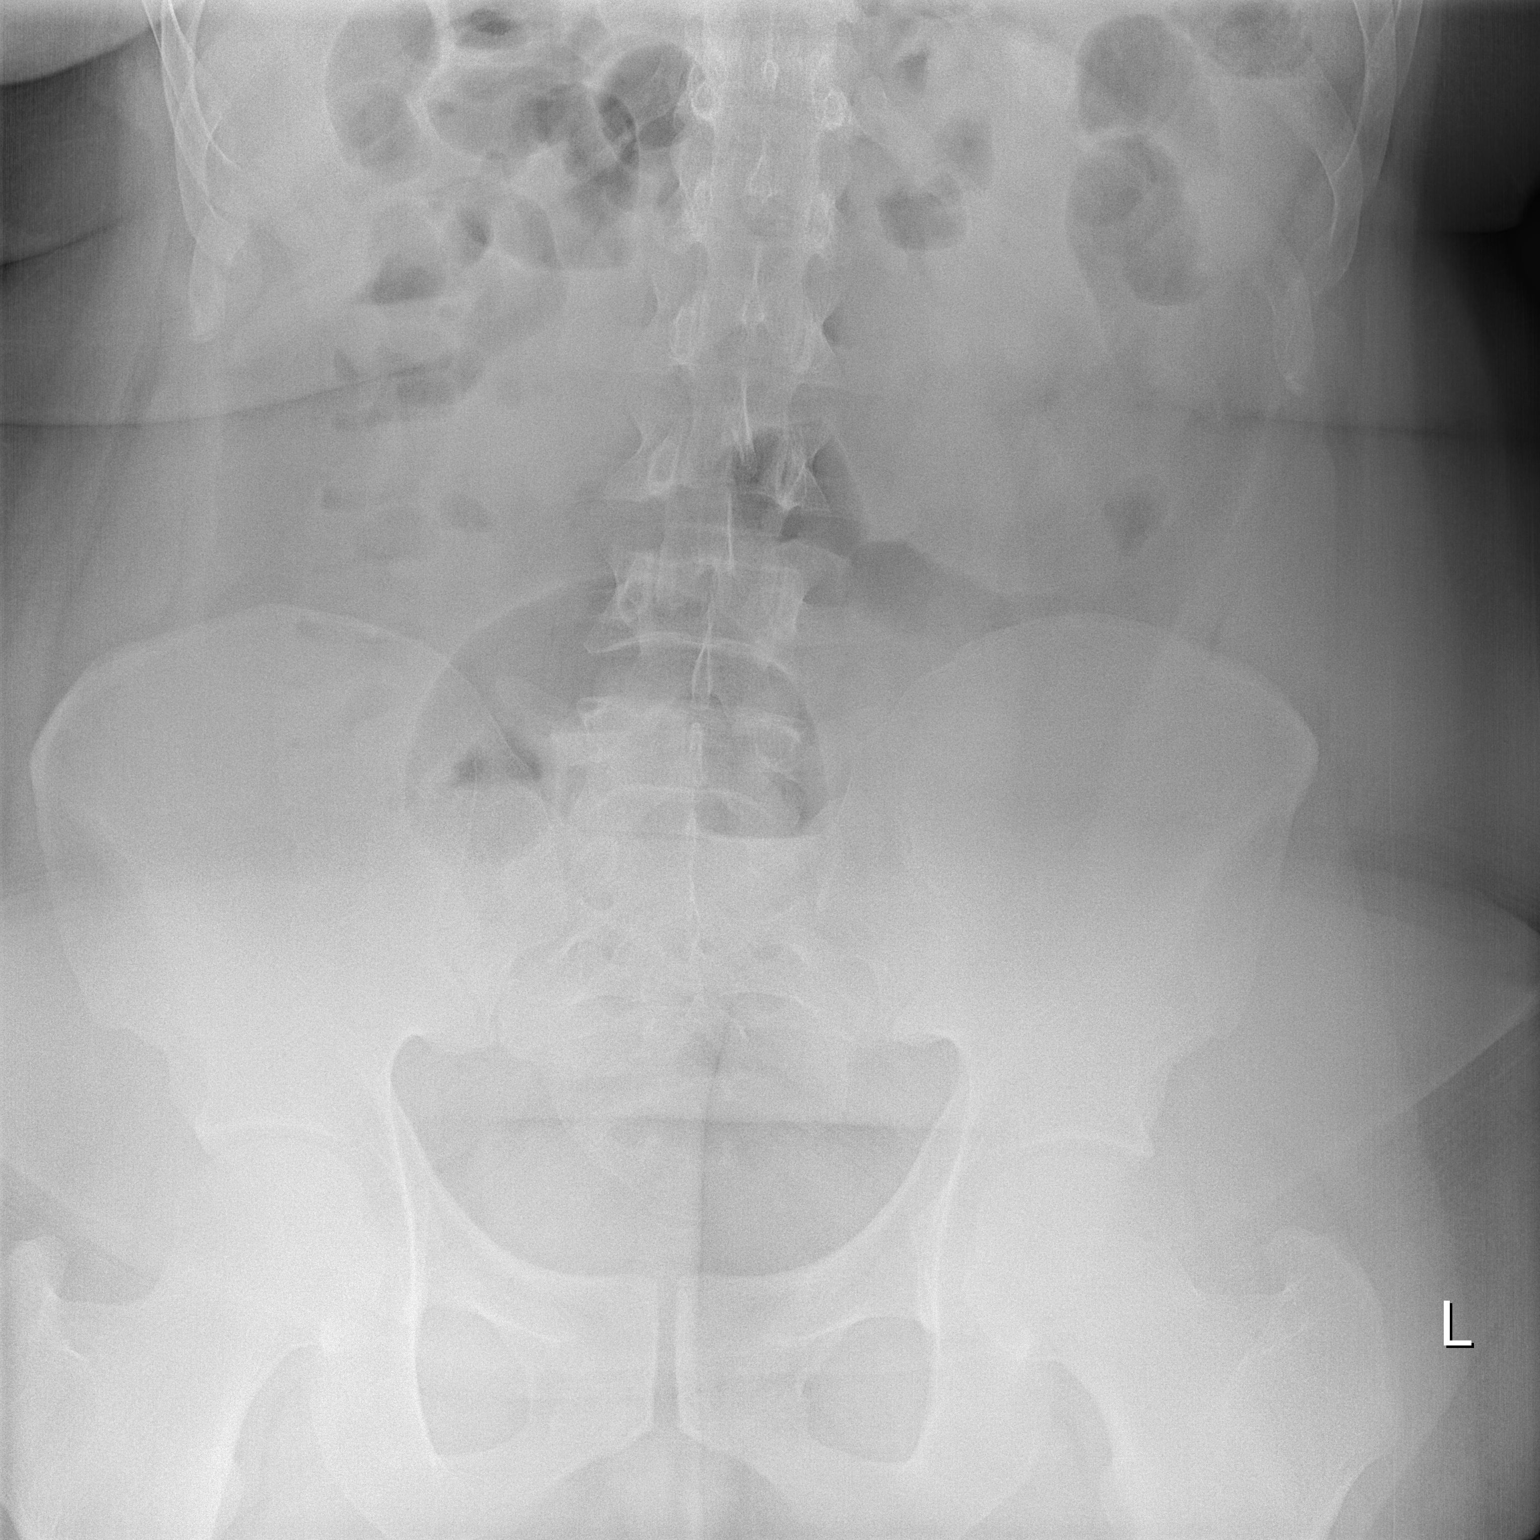

[w abdomen upright (2 of 2)]
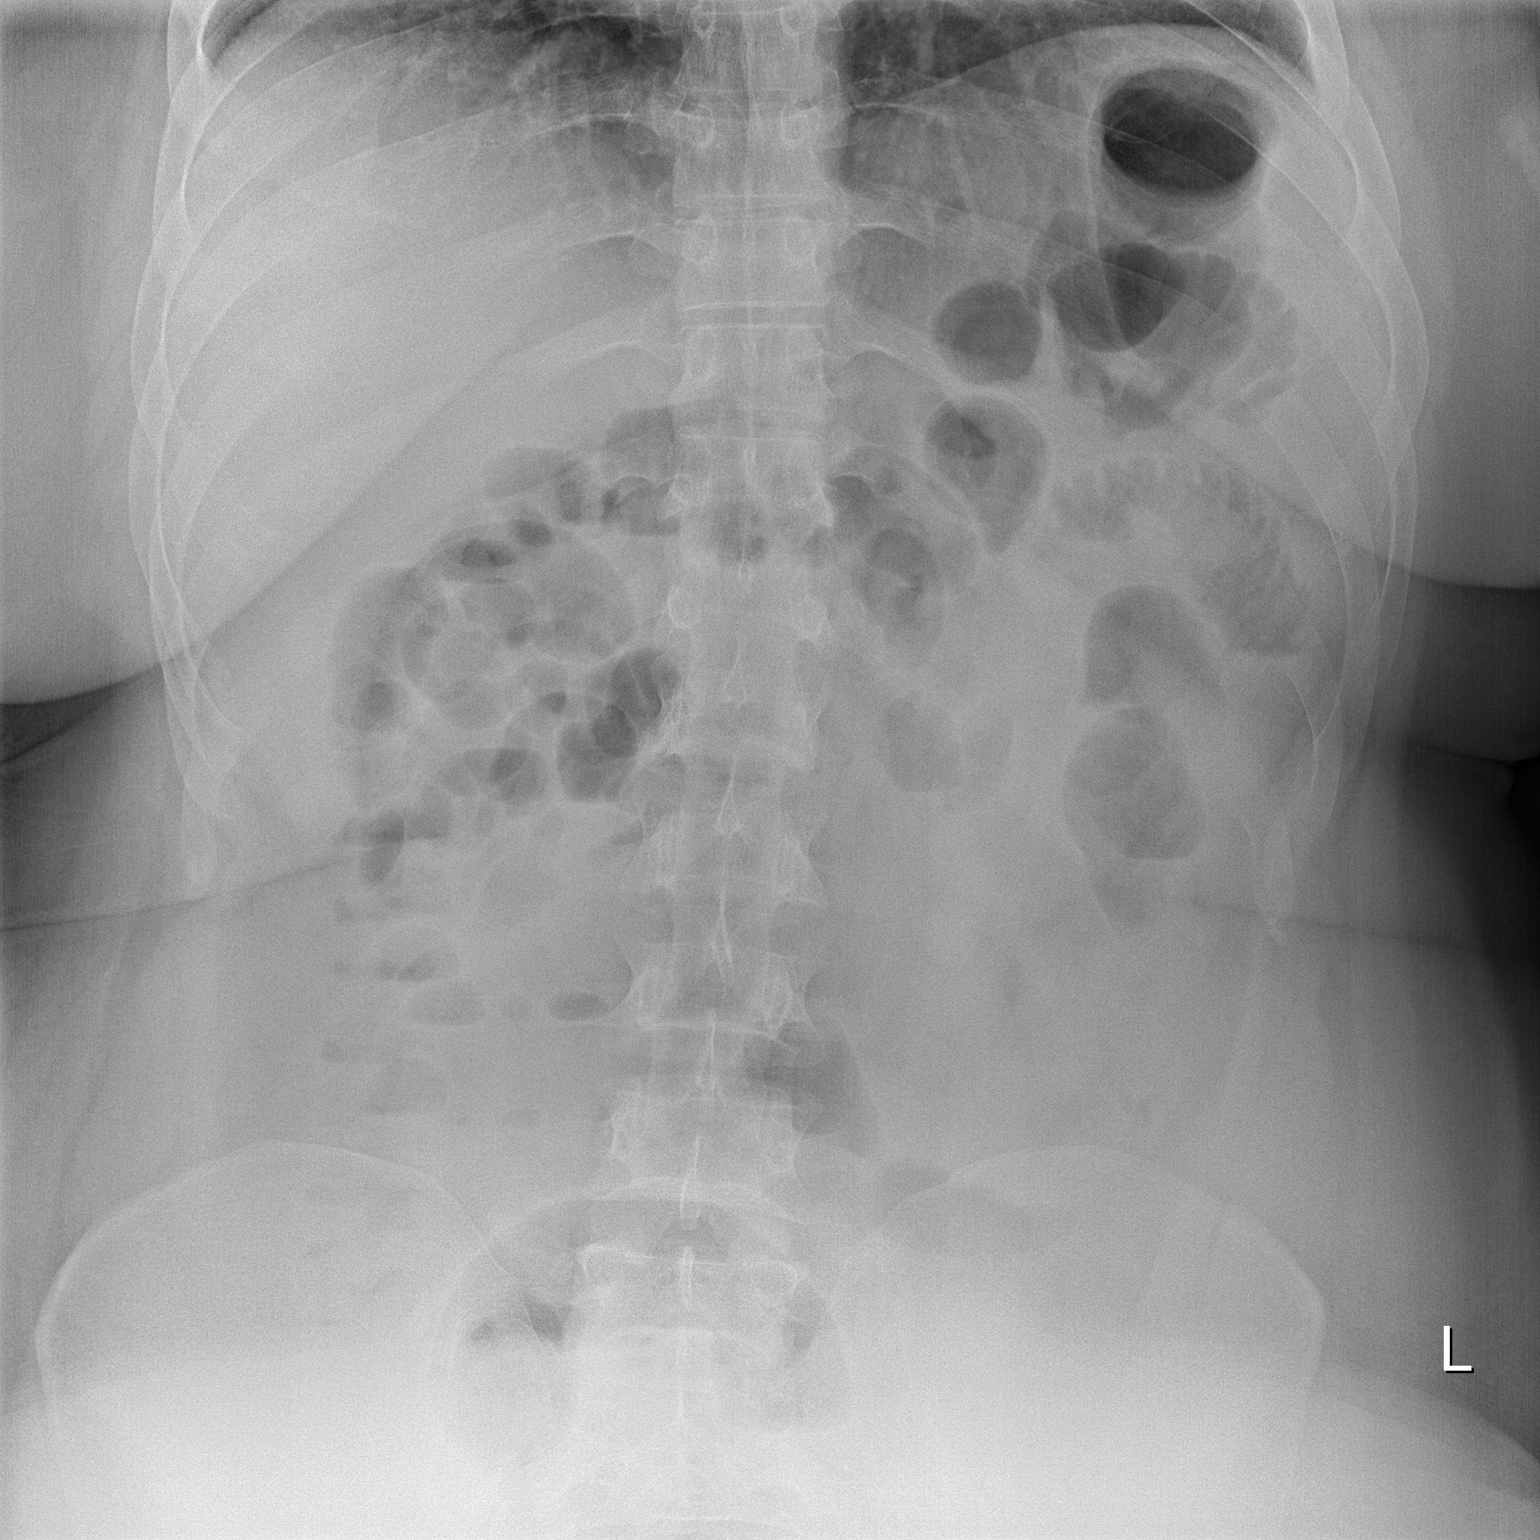

[2 of 2 positions shown; findings below may reference images not displayed]

FINDINGS: Gas within stomach, small bowel, and colon. Upright image
demonstrates air-fluid levels of the right and left abdomen. No
abnormally distended small bowel or colon. No radiopaque foreign
body. No unexpected calcifications.

No displaced fracture.
IMPRESSION: Multiple air-fluid levels on the upright image, without evidence of
obstruction. Findings are nonspecific though potentially could
represent enteritis.

## 2022-08-04 ENCOUNTER — Other Ambulatory Visit: Payer: Self-pay | Admitting: Gastroenterology

## 2022-08-04 DIAGNOSIS — R7989 Other specified abnormal findings of blood chemistry: Secondary | ICD-10-CM

## 2022-08-04 DIAGNOSIS — R1011 Right upper quadrant pain: Secondary | ICD-10-CM

## 2022-08-11 ENCOUNTER — Inpatient Hospital Stay: Admission: RE | Admit: 2022-08-11 | Payer: BLUE CROSS/BLUE SHIELD | Source: Ambulatory Visit

## 2022-08-16 ENCOUNTER — Other Ambulatory Visit: Payer: BLUE CROSS/BLUE SHIELD

## 2022-08-29 ENCOUNTER — Inpatient Hospital Stay: Admission: RE | Admit: 2022-08-29 | Payer: Self-pay | Source: Ambulatory Visit

## 2022-08-31 ENCOUNTER — Ambulatory Visit
Admission: RE | Admit: 2022-08-31 | Discharge: 2022-08-31 | Disposition: A | Discharge status: Home / Self Care | Payer: Commercial Managed Care - HMO | Source: Ambulatory Visit | Attending: Gastroenterology | Admitting: Gastroenterology

## 2022-08-31 DIAGNOSIS — R7989 Other specified abnormal findings of blood chemistry: Secondary | ICD-10-CM

## 2022-08-31 DIAGNOSIS — R1011 Right upper quadrant pain: Secondary | ICD-10-CM

## 2023-08-07 ENCOUNTER — Emergency Department (HOSPITAL_BASED_OUTPATIENT_CLINIC_OR_DEPARTMENT_OTHER): Payer: Commercial Managed Care - HMO

## 2023-08-07 ENCOUNTER — Encounter (HOSPITAL_BASED_OUTPATIENT_CLINIC_OR_DEPARTMENT_OTHER): Payer: Self-pay | Admitting: Urology

## 2023-08-07 ENCOUNTER — Emergency Department (HOSPITAL_BASED_OUTPATIENT_CLINIC_OR_DEPARTMENT_OTHER)
Admission: EM | Admit: 2023-08-07 | Discharge: 2023-08-07 | Disposition: A | Discharge status: Home / Self Care | Payer: Commercial Managed Care - HMO | Attending: Emergency Medicine | Admitting: Emergency Medicine

## 2023-08-07 DIAGNOSIS — N4889 Other specified disorders of penis: Secondary | ICD-10-CM

## 2023-08-07 DIAGNOSIS — L02214 Cutaneous abscess of groin: Secondary | ICD-10-CM | POA: Insufficient documentation

## 2023-08-07 LAB — URINALYSIS, ROUTINE W REFLEX MICROSCOPIC
Bilirubin Urine: NEGATIVE
Glucose, UA: NEGATIVE mg/dL
Ketones, ur: NEGATIVE mg/dL
Leukocytes,Ua: NEGATIVE
Nitrite: NEGATIVE
Protein, ur: NEGATIVE mg/dL
Specific Gravity, Urine: 1.01 (ref 1.005–1.030)
pH: 5.5 (ref 5.0–8.0)

## 2023-08-07 LAB — RAPID HIV SCREEN (HIV 1/2 AB+AG)
HIV 1/2 Antibodies: NONREACTIVE
HIV-1 P24 Antigen - HIV24: NONREACTIVE

## 2023-08-07 LAB — CBC WITH DIFFERENTIAL/PLATELET
Abs Immature Granulocytes: 0.04 10*3/uL (ref 0.00–0.07)
Basophils Absolute: 0.1 10*3/uL (ref 0.0–0.1)
Basophils Relative: 1 %
Eosinophils Absolute: 0.1 10*3/uL (ref 0.0–0.5)
Eosinophils Relative: 1 %
HCT: 44.9 % (ref 39.0–52.0)
Hemoglobin: 15 g/dL (ref 13.0–17.0)
Immature Granulocytes: 1 %
Lymphocytes Relative: 16 %
Lymphs Abs: 1.4 10*3/uL (ref 0.7–4.0)
MCH: 31.8 pg (ref 26.0–34.0)
MCHC: 33.4 g/dL (ref 30.0–36.0)
MCV: 95.3 fL (ref 80.0–100.0)
Monocytes Absolute: 0.7 10*3/uL (ref 0.1–1.0)
Monocytes Relative: 8 %
Neutro Abs: 6.2 10*3/uL (ref 1.7–7.7)
Neutrophils Relative %: 73 %
Platelets: 412 10*3/uL — ABNORMAL HIGH (ref 150–400)
RBC: 4.71 MIL/uL (ref 4.22–5.81)
RDW: 14.9 % (ref 11.5–15.5)
WBC: 8.4 10*3/uL (ref 4.0–10.5)
nRBC: 0 % (ref 0.0–0.2)

## 2023-08-07 LAB — BASIC METABOLIC PANEL
Anion gap: 12 (ref 5–15)
BUN: 8 mg/dL (ref 6–20)
CO2: 22 mmol/L (ref 22–32)
Calcium: 9.3 mg/dL (ref 8.9–10.3)
Chloride: 99 mmol/L (ref 98–111)
Creatinine, Ser: 0.88 mg/dL (ref 0.61–1.24)
GFR, Estimated: >60 mL/min (ref >60)
Glucose, Bld: 94 mg/dL (ref 70–99)
Potassium: 3.8 mmol/L (ref 3.5–5.1)
Sodium: 133 mmol/L — ABNORMAL LOW (ref 135–145)

## 2023-08-07 LAB — URINALYSIS, MICROSCOPIC (REFLEX): WBC, UA: NONE SEEN WBC/hpf (ref 0–5)

## 2023-08-07 LAB — LACTIC ACID, PLASMA: Lactic Acid, Venous: 1.5 mmol/L (ref 0.5–1.9)

## 2023-08-07 MED ORDER — IOHEXOL 300 MG/ML  SOLN
125.0000 mL | Freq: Once | INTRAMUSCULAR | Status: AC | PRN
  Administered 2023-08-07: 125 mL via INTRAVENOUS

## 2023-08-07 MED ORDER — BACITRACIN 500 UNIT/GM EX OINT
1.0000 | TOPICAL_OINTMENT | Freq: Two times a day (BID) | CUTANEOUS | 0 refills | Status: AC
  Filled 2023-08-07: qty 112, 62d supply, fill #0

## 2023-08-07 NOTE — Discharge Instructions (Addendum)
Report to your urology appointment. Use the bacitracin ointment. Return to the ER for new or worsening symptoms.

## 2023-08-07 NOTE — ED Provider Notes (Cosign Needed Addendum)
[facility] Provider Note   CSN: [id number] Arrival date & time: 08/07/23  1457     History Chief Complaint  Patient presents with   Abscess    Larry Sanders is a 37 y.o. male presents to the ER for evaluation of groin swelling for the past month. The patient reports that he had something similar to this a few years prior however it only lasted three days.  He reports that he has been dealing with this for at least the past month.  He saw his primary care provider who placed him on 2 weeks of Bactrim.  He reports that he completed this antibiotic and it the swelling did improve some however it is still present.  He denies any dysuria or hematuria. Denies any difficulty urinating.  Denies any pain into his testicles or scrotum.  He denies any belly pain or nausea or vomiting.  Denies any fevers.  He denies any drainage from the area or any trouble with defecation.  He reports that he is in a monogamous relationship and is not concerned for any STDs.  He denies any discharge from the penis.  He reports that the swelling has improved some but was still present which is why he was concerned to come into the emergency department. He denies any trauma to the area.  The patient reports that since his and present for the past 4 weeks, he has had his foreskin retracted for 4 weeks as well. NKDA.    Abscess Associated symptoms: no fever, no nausea and no vomiting        Home Medications Prior to Admission medications   Medication Sig Start Date End Date Taking? Authorizing Provider  ciprofloxacin (CIPRO) 500 MG tablet Take 1 tablet (500 mg total) by mouth 2 (two) times daily. 11/26/15   Joseph Art, DO      Allergies    Patient has no known allergies.    Review of Systems   Review of Systems  Constitutional:  Negative for chills and fever.  Respiratory:  Negative for shortness of breath.   Cardiovascular:  Negative for chest pain.   Gastrointestinal:  Negative for abdominal pain, constipation, diarrhea, nausea and vomiting.  Genitourinary:  Positive for penile swelling. Negative for difficulty urinating, dysuria, hematuria, penile discharge, penile pain, scrotal swelling and testicular pain.    Physical Exam Updated Vital Signs BP (!) 149/101 (BP Location: Left Arm)   Pulse (!) 116   Temp 98.6 F (37 C)   Resp 18   Ht 5\' 7"  (1.702 m)   Wt 133.8 kg   SpO2 100%   BMI 46.20 kg/m  Physical Exam Vitals and nursing note reviewed. Exam conducted with a chaperone present Thayer Ohm, EMT-P).  Constitutional:      General: He is not in acute distress.    Appearance: Normal appearance. He is not ill-appearing or toxic-appearing.  Eyes:     General: No scleral icterus. Pulmonary:     Effort: Pulmonary effort is normal. No respiratory distress.  Abdominal:     Palpations: Abdomen is soft.     Tenderness: There is no abdominal tenderness. There is no guarding or rebound.  Genitourinary:    Comments: Significant swelling seen to the more inferior aspect of the penis.  There is some irritation and ulceration seen at the area where the head of the penis meets the shaft more on the right.  There is some smegma seen more to the left in  between this growth and the glans.  Patient has a large mons area that is causing pressure onto the penis and growth as well.  There is a small ulcer seen to the right side of the growth.  The growth seen is not warm or have any increase in warmth.  It is soft.  Please see the added pictures.  There is no pain or swelling or overlying skin changes seen to the scrotum or the perennial area.  No induration or fluctuance noted to this area as well. Skin:    General: Skin is dry.     Findings: No rash.  Neurological:     General: No focal deficit present.     Mental Status: He is alert. Mental status is at baseline.  Psychiatric:        Mood and Affect: Mood normal.                ED  Results / Procedures / Treatments   Labs (all labs ordered are listed, but only abnormal results are displayed) Labs Reviewed  CBC WITH DIFFERENTIAL/PLATELET - Abnormal; Notable for the following components:      Result Value   Platelets 412 (*)    All other components within normal limits  BASIC METABOLIC PANEL - Abnormal; Notable for the following components:   Sodium 133 (*)    All other components within normal limits  LACTIC ACID, PLASMA  LACTIC ACID, PLASMA  URINALYSIS, ROUTINE W REFLEX MICROSCOPIC    EKG None  Radiology No results found.  Procedures Procedures   Medications Ordered in ED Medications - No data to display  ED Course/ Medical Decision Making/ A&P   Medical Decision Making Amount and/or Complexity of Data Reviewed Labs: ordered. Radiology: ordered.  Risk OTC drugs. Prescription drug management.   37 y.o. male presents to the ER for evaluation of groin swelling. Differential diagnosis includes but is not limited to Fournier's, paraphimosis, hydrocele, varicocele, abscess. Vital signs unremarkable. Physical exam as noted above.   I discussed this with my attending.  Will order CT of the pelvis, labs, and consult urology. Patient verbal consent was gained to add pictures into the chart.   I independently reviewed and interpreted the patient's labs.  BMP shows mildly decreased sodium 133 otherwise no electrolyte abnormality.  CBC without leukocytosis or anemia.  Mildly increased platelet of 412.  Urinalysis shows trace amount of hemoglobin with rare bacteria however no red blood cells, white blood cells, leukocytes, or nitrates are seen.  Lactic acid within normal limits.  HIV, RPR, GC pending.  I consulted urology and spoke with Dr. Annabell Howells who evaluated the images in the chart.  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  The patient has an appointment on 08-15-2023, by Dr. Annabell Howells will see if  he can be seen sooner.  I asked about oral antibiotics however he reports that likely he does not need these and just recommends a topical antibiotic for the skin breakdown and ulcerations seen. He reports that the patient is better off being seen in the clinic other than having a procedure performed in the ER given that this has been happening for weeks.   CT imaging pending at this time.  Will handoff to oncoming shift.  Care of Larry Sanders  transferred to [facility] at the end of my shift as the patient will require reassessment once labs/imaging have resulted. Patient presentation, ED course, and  plan of care discussed with review of all pertinent labs and imaging. Please see his/her note for further details regarding further ED course and disposition. Plan at time of handoff is follow up with CT scan. This may be altered or completely changed at the discretion of the oncoming team pending results of further workup.  Portions of this report may have been transcribed using voice recognition software. Every effort was made to ensure accuracy; however, inadvertent computerized transcription errors may be present.   I discussed this case with my attending physician who cosigned this note including patient's presenting symptoms, physical exam, and planned diagnostics and interventions. Attending physician stated agreement with plan or made changes to plan which were implemented.   Final Clinical Impression(s) / ED Diagnoses Final diagnoses:  None    Rx / DC Orders ED Discharge Orders     None         Achille Rich, PA-C 08/07/23 2016    Achille Rich, PA-C 08/07/23 2017    Laurence Spates, MD 08/08/23 1252

## 2023-08-07 NOTE — ED Notes (Signed)
Pt ambulatory to BR to provide urine sample.

## 2023-08-07 NOTE — ED Triage Notes (Signed)
States boil to groin x 1 month Was on antibiotics from pcp 2 weeks ago, was better but has gotten worse again   denies drainage

## 2023-08-07 NOTE — ED Provider Notes (Signed)
Care assumed from [facility], PA-C at shift change pending CT read.  See her note for full HPI.  In short, patient is a 37 year old male who presents to the ED due to groin swelling.  Patient recently placed on 2 weeks of Bactrim.  No improvement in edema.  Patient has a urology appointment this week.  At shift change, CT pelvis pending.  Previous provider spoke to Dr. Annabell Howells with urology who believed the patient's symptoms were likely related to chronic paraphimosis.  Dr. Annabell Howells recommended topical antibiotics which previous provider prescribed.  CT pelvis personally reviewed and interpreted which demonstrates fluid attenuation lesion along the inferior aspect of the penis suspicious for focal abscess.  Bedside ultrasound formed by Dr. Earlene Plater who did not see any evidence of abscess on exam.  Dr. Earlene Plater notes that patient is stable for discharge with urology follow-up. Strict ED precautions discussed with patient. Patient states understanding and agrees to plan. Patient discharged home in no acute distress and stable vitals.    Jesusita Oka 08/07/23 2055    Laurence Spates, MD 08/08/23 [phone]

## 2023-08-08 ENCOUNTER — Other Ambulatory Visit (HOSPITAL_BASED_OUTPATIENT_CLINIC_OR_DEPARTMENT_OTHER): Payer: Self-pay

## 2023-08-08 ENCOUNTER — Encounter (HOSPITAL_COMMUNITY): Payer: Self-pay | Admitting: Urology

## 2023-08-08 ENCOUNTER — Other Ambulatory Visit: Payer: Self-pay

## 2023-08-08 ENCOUNTER — Other Ambulatory Visit: Payer: Self-pay | Admitting: Urology

## 2023-08-08 LAB — GC/CHLAMYDIA PROBE AMP (~~LOC~~) NOT AT ARMC
Chlamydia: NEGATIVE
Comment: NEGATIVE
Comment: NORMAL
Neisseria Gonorrhea: NEGATIVE

## 2023-08-08 LAB — RPR: RPR Ser Ql: NONREACTIVE

## 2023-08-08 NOTE — Progress Notes (Addendum)
COVID Vaccine Completed:  Date of COVID positive in last 90 days:  No  PCP - Angelina Pih, MD Cardiologist - N/A  Chest x-ray - N/A EKG - N/A Stress Test - N/A ECHO - N/A Cardiac Cath - N/A Pacemaker/ICD device last checked: Spinal Cord Stimulator:N/A  Bowel Prep - N/A  Sleep Study - Yes, +sleep apnea CPAP - No longer uses, stopped using years ago  Fasting Blood Sugar - N/A Checks Blood Sugar _____ times a day  Last dose of GLP1 agonist-  N/A GLP1 instructions:  N/A   Last dose of SGLT-2 inhibitors-  N/A SGLT-2 instructions: N/A  Blood Thinner Instructions:  N/A Aspirin Instructions: Last Dose:  Activity level:  Can go up a flight of stairs and perform activities of daily living without stopping and without symptoms of chest pain or shortness of breath.    Anesthesia review: N/A  Patient denies shortness of breath, fever, cough and chest pain at PAT appointment  Patient verbalized understanding of instructions that were given to them at the PAT appointment. Patient was also instructed that they will need to review over the PAT instructions again at home before surgery.

## 2023-08-08 NOTE — Progress Notes (Signed)
Pt aware of 0915 arrival to admitting 08/09/23

## 2023-08-09 ENCOUNTER — Encounter (HOSPITAL_COMMUNITY): Payer: Self-pay | Admitting: Urology

## 2023-08-09 ENCOUNTER — Other Ambulatory Visit: Payer: Self-pay

## 2023-08-09 ENCOUNTER — Encounter (HOSPITAL_COMMUNITY)
Admission: RE | Disposition: A | Discharge status: Home / Self Care | Payer: Self-pay | Source: Home / Self Care | Attending: Urology

## 2023-08-09 ENCOUNTER — Ambulatory Visit (HOSPITAL_COMMUNITY)
Admission: RE | Admit: 2023-08-09 | Discharge: 2023-08-09 | Disposition: A | Discharge status: Home / Self Care | Payer: Commercial Managed Care - HMO | Attending: Urology | Admitting: Urology

## 2023-08-09 ENCOUNTER — Ambulatory Visit (HOSPITAL_BASED_OUTPATIENT_CLINIC_OR_DEPARTMENT_OTHER): Payer: Commercial Managed Care - HMO | Admitting: Anesthesiology

## 2023-08-09 ENCOUNTER — Ambulatory Visit (HOSPITAL_COMMUNITY): Payer: Commercial Managed Care - HMO | Admitting: Anesthesiology

## 2023-08-09 DIAGNOSIS — N472 Paraphimosis: Secondary | ICD-10-CM | POA: Diagnosis present

## 2023-08-09 DIAGNOSIS — Q5564 Hidden penis: Secondary | ICD-10-CM | POA: Insufficient documentation

## 2023-08-09 DIAGNOSIS — Z87891 Personal history of nicotine dependence: Secondary | ICD-10-CM | POA: Diagnosis not present

## 2023-08-09 DIAGNOSIS — G4730 Sleep apnea, unspecified: Secondary | ICD-10-CM | POA: Diagnosis not present

## 2023-08-09 DIAGNOSIS — Z6841 Body Mass Index (BMI) 40.0 and over, adult: Secondary | ICD-10-CM | POA: Diagnosis not present

## 2023-08-09 HISTORY — PX: CIRCUMCISION: SHX1350

## 2023-08-09 SURGERY — CIRCUMCISION, ADULT
Anesthesia: General

## 2023-08-09 MED ORDER — DEXMEDETOMIDINE HCL IN NACL 80 MCG/20ML IV SOLN
INTRAVENOUS | Status: AC
  Filled 2023-08-09: qty 20

## 2023-08-09 MED ORDER — LACTATED RINGERS IV SOLN: INTRAVENOUS | Status: DC

## 2023-08-09 MED ORDER — LIDOCAINE HCL (PF) 2 % IJ SOLN
INTRAMUSCULAR | Status: AC
  Filled 2023-08-09: qty 5

## 2023-08-09 MED ORDER — SENNOSIDES-DOCUSATE SODIUM 8.6-50 MG PO TABS: 1.0000 | ORAL_TABLET | Freq: Two times a day (BID) | ORAL | 0 refills | Status: AC

## 2023-08-09 MED ORDER — ORAL CARE MOUTH RINSE: 15.0000 mL | Freq: Once | OROMUCOSAL | Status: AC

## 2023-08-09 MED ORDER — FENTANYL CITRATE (PF) 100 MCG/2ML IJ SOLN
INTRAMUSCULAR | Status: DC | PRN
  Administered 2023-08-09: 25 ug via INTRAVENOUS
  Administered 2023-08-09 (×2): 50 ug via INTRAVENOUS

## 2023-08-09 MED ORDER — PROPOFOL 10 MG/ML IV BOLUS
INTRAVENOUS | Status: DC | PRN
Start: 2023-08-09 — End: 2023-08-09
  Administered 2023-08-09: 350 mg via INTRAVENOUS

## 2023-08-09 MED ORDER — ONDANSETRON HCL 4 MG/2ML IJ SOLN
INTRAMUSCULAR | Status: DC | PRN
  Administered 2023-08-09: 4 mg via INTRAVENOUS

## 2023-08-09 MED ORDER — ONDANSETRON HCL 4 MG/2ML IJ SOLN
INTRAMUSCULAR | Status: AC
  Filled 2023-08-09: qty 2

## 2023-08-09 MED ORDER — MIDAZOLAM HCL 2 MG/2ML IJ SOLN
INTRAMUSCULAR | Status: AC
  Filled 2023-08-09: qty 2

## 2023-08-09 MED ORDER — DEXAMETHASONE SODIUM PHOSPHATE 10 MG/ML IJ SOLN
INTRAMUSCULAR | Status: DC | PRN
  Administered 2023-08-09: 5 mg via INTRAVENOUS

## 2023-08-09 MED ORDER — OXYCODONE-ACETAMINOPHEN 5-325 MG PO TABS
1.0000 | ORAL_TABLET | Freq: Four times a day (QID) | ORAL | 0 refills | Status: AC | PRN
Start: 2023-08-09 — End: 2024-08-08

## 2023-08-09 MED ORDER — CLINDAMYCIN PHOSPHATE 900 MG/50ML IV SOLN
900.0000 mg | INTRAVENOUS | Status: AC
  Administered 2023-08-09: 900 mg via INTRAVENOUS
  Filled 2023-08-09: qty 50

## 2023-08-09 MED ORDER — FENTANYL CITRATE (PF) 100 MCG/2ML IJ SOLN
INTRAMUSCULAR | Status: AC
  Filled 2023-08-09: qty 2

## 2023-08-09 MED ORDER — MIDAZOLAM HCL 5 MG/5ML IJ SOLN
INTRAMUSCULAR | Status: DC | PRN
  Administered 2023-08-09: 2 mg via INTRAVENOUS

## 2023-08-09 MED ORDER — FENTANYL CITRATE PF 50 MCG/ML IJ SOSY: 25.0000 ug | PREFILLED_SYRINGE | INTRAMUSCULAR | Status: DC | PRN

## 2023-08-09 MED ORDER — LIDOCAINE HCL (PF) 1 % IJ SOLN
INTRAMUSCULAR | Status: AC
  Filled 2023-08-09: qty 30

## 2023-08-09 MED ORDER — ACETAMINOPHEN 500 MG PO TABS
1000.0000 mg | ORAL_TABLET | Freq: Once | ORAL | Status: AC
  Administered 2023-08-09: 1000 mg via ORAL
  Filled 2023-08-09: qty 2

## 2023-08-09 MED ORDER — LIDOCAINE HCL (PF) 1 % IJ SOLN
INTRAMUSCULAR | Status: DC | PRN
  Administered 2023-08-09: 20 mL

## 2023-08-09 MED ORDER — 0.9 % SODIUM CHLORIDE (POUR BTL) OPTIME
TOPICAL | Status: DC | PRN
  Administered 2023-08-09: 1000 mL

## 2023-08-09 MED ORDER — DEXMEDETOMIDINE HCL IN NACL 80 MCG/20ML IV SOLN
INTRAVENOUS | Status: DC | PRN
  Administered 2023-08-09: 12 ug via INTRAVENOUS

## 2023-08-09 MED ORDER — PROPOFOL 10 MG/ML IV BOLUS
INTRAVENOUS | Status: AC
  Filled 2023-08-09: qty 20

## 2023-08-09 MED ORDER — LIDOCAINE 2% (20 MG/ML) 5 ML SYRINGE
INTRAMUSCULAR | Status: DC | PRN
  Administered 2023-08-09: 100 mg via INTRAVENOUS

## 2023-08-09 MED ORDER — CHLORHEXIDINE GLUCONATE 0.12 % MT SOLN
15.0000 mL | Freq: Once | OROMUCOSAL | Status: AC
  Administered 2023-08-09: 15 mL via OROMUCOSAL

## 2023-08-09 MED ORDER — DEXAMETHASONE SODIUM PHOSPHATE 10 MG/ML IJ SOLN
INTRAMUSCULAR | Status: AC
  Filled 2023-08-09: qty 1

## 2023-08-09 SURGICAL SUPPLY — 34 items
BAG COUNTER SPONGE SURGICOUNT (BAG) IMPLANT
BAG SPNG CNTER NS LX DISP (BAG)
BLADE SURG 15 STRL LF DISP TIS (BLADE) ×1 IMPLANT
BLADE SURG 15 STRL SS (BLADE) ×1
BNDG CMPR 75X21 PLY HI ABS (MISCELLANEOUS) ×1
BNDG CMPR 75X41 PLY HI ABS (GAUZE/BANDAGES/DRESSINGS) ×1
BNDG COHESIVE 1X5 TAN STRL LF (GAUZE/BANDAGES/DRESSINGS) ×1 IMPLANT
BNDG STRETCH 4X75 STRL LF (GAUZE/BANDAGES/DRESSINGS) IMPLANT
DRAPE LAPAROTOMY T 102X78X121 (DRAPES) ×1 IMPLANT
ELECT NDL TIP 2.8 STRL (NEEDLE) ×1 IMPLANT
ELECT NEEDLE TIP 2.8 STRL (NEEDLE) ×1 IMPLANT
ELECT REM PT RETURN 15FT ADLT (MISCELLANEOUS) ×1 IMPLANT
GAUZE 4X4 16PLY ~~LOC~~+RFID DBL (SPONGE) ×1 IMPLANT
GAUZE SPONGE 4X4 12PLY STRL (GAUZE/BANDAGES/DRESSINGS) ×1 IMPLANT
GAUZE STRETCH 2X75IN STRL (MISCELLANEOUS) ×1 IMPLANT
GAUZE XEROFORM 1X8 LF (GAUZE/BANDAGES/DRESSINGS) IMPLANT
GLOVE SURG LX STRL 7.5 STRW (GLOVE) ×1 IMPLANT
GOWN SRG XL LVL 4 BRTHBL STRL (GOWNS) ×1 IMPLANT
GOWN STRL NON-REIN XL LVL4 (GOWNS) ×1
KIT BASIN OR (CUSTOM PROCEDURE TRAY) ×1 IMPLANT
KIT TURNOVER KIT A (KITS) IMPLANT
NDL HYPO 25X1 1.5 SAFETY (NEEDLE) IMPLANT
NEEDLE HYPO 25X1 1.5 SAFETY (NEEDLE) IMPLANT
NS IRRIG 1000ML POUR BTL (IV SOLUTION) IMPLANT
PACK BASIC VI WITH GOWN DISP (CUSTOM PROCEDURE TRAY) ×1 IMPLANT
PENCIL SMOKE EVACUATOR (MISCELLANEOUS) IMPLANT
SPIKE FLUID TRANSFER (MISCELLANEOUS) IMPLANT
SUT CHROMIC 4 0 P 3 18 (SUTURE) ×2 IMPLANT
SUT CHROMIC 4 0 PS 2 18 (SUTURE) ×2 IMPLANT
SUT VIC AB 3-0 SH 27 (SUTURE) ×3
SUT VIC AB 3-0 SH 27XBRD (SUTURE) IMPLANT
SYR CONTROL 10ML LL (SYRINGE) IMPLANT
TOWEL OR 17X26 10 PK STRL BLUE (TOWEL DISPOSABLE) ×1 IMPLANT
WATER STERILE IRR 1000ML POUR (IV SOLUTION) IMPLANT

## 2023-08-09 NOTE — Anesthesia Preprocedure Evaluation (Addendum)
Anesthesia Evaluation  Patient identified by MRN, date of birth, ID band Patient awake    Reviewed: Allergy & Precautions, NPO status , Patient's Chart, lab work & pertinent test results  Airway Mallampati: III  TM Distance: >3 FB Neck ROM: Full    Dental  (+) Teeth Intact, Poor Dentition, Dental Advisory Given   Pulmonary sleep apnea , former smoker (uses chewing tobacco currently)   Pulmonary exam normal breath sounds clear to auscultation       Cardiovascular negative cardio ROS Normal cardiovascular exam Rhythm:Regular Rate:Normal     Neuro/Psych negative neurological ROS  negative psych ROS   GI/Hepatic negative GI ROS, Neg liver ROS,,,  Endo/Other    Morbid obesity (BMI 42)  Renal/GU negative Renal ROS  negative genitourinary   Musculoskeletal negative musculoskeletal ROS (+)    Abdominal   Peds  Hematology negative hematology ROS (+)   Anesthesia Other Findings   Reproductive/Obstetrics                             Anesthesia Physical Anesthesia Plan  ASA: 3  Anesthesia Plan: General   Post-op Pain Management: Tylenol PO (pre-op)*   Induction: Intravenous  PONV Risk Score and Plan: 1 and Ondansetron, Dexamethasone and Midazolam  Airway Management Planned: LMA  Additional Equipment:   Intra-op Plan:   Post-operative Plan: Extubation in OR  Informed Consent: I have reviewed the patients History and Physical, chart, labs and discussed the procedure including the risks, benefits and alternatives for the proposed anesthesia with the patient or authorized representative who has indicated his/her understanding and acceptance.     Dental advisory given  Plan Discussed with: CRNA  Anesthesia Plan Comments:        Anesthesia Quick Evaluation

## 2023-08-09 NOTE — Anesthesia Procedure Notes (Signed)
Procedure Name: LMA Insertion Date/Time: 08/09/2023 11:42 AM  Performed by: Bishop Limbo, CRNAPre-anesthesia Checklist: Patient identified, Emergency Drugs available, Suction available and Patient being monitored Patient Re-evaluated:Patient Re-evaluated prior to induction Oxygen Delivery Method: Circle System Utilized Preoxygenation: Pre-oxygenation with 100% oxygen Induction Type: IV induction Ventilation: Mask ventilation without difficulty LMA: LMA inserted LMA Size: 5.0 Number of attempts: 1 Placement Confirmation: positive ETCO2 Tube secured with: Tape Dental Injury: Teeth and Oropharynx as per pre-operative assessment

## 2023-08-09 NOTE — Brief Op Note (Signed)
08/09/2023  12:23 PM  PATIENT:  Larry Sanders  37 y.o. male  PRE-OPERATIVE DIAGNOSIS:  PARAPHIMOSIS  POST-OPERATIVE DIAGNOSIS:  PARAPHIMOSIS  PROCEDURE:  Procedure(s) with comments: CIRCUMCISION ADULT (N/A) - 1 HR FOR CASE  SURGEON:  Surgeons and Role:    * Nithya Meriweather, Delbert Phenix., MD - Primary  PHYSICIAN ASSISTANT:   ASSISTANTS: none   ANESTHESIA:   local and general  EBL:  2 mL   BLOOD ADMINISTERED:none  DRAINS: none   LOCAL MEDICATIONS USED:  LIDOCAINE    DISPOSITION OF SPECIMEN:   discard (reduncant edematous foreskin)  COUNTS:  YES  TOURNIQUET:  * No tourniquets in log *  DICTATION: .Other Dictation: Dictation Number [id number]  PLAN OF CARE: Discharge to home after PACU  PATIENT DISPOSITION:  PACU - hemodynamically stable.   Delay start of Pharmacological VTE agent (>24hrs) due to surgical blood loss or risk of bleeding: not applicable

## 2023-08-09 NOTE — Transfer of Care (Signed)
Immediate Anesthesia Transfer of Care Note  Patient: Nathian Calahan  Procedure(s) Performed: CIRCUMCISION ADULT  Patient Location: PACU  Anesthesia Type:General  Level of Consciousness: awake, alert , oriented, and patient cooperative  Airway & Oxygen Therapy: Patient Spontanous Breathing  Post-op Assessment: Report given to RN and Post -op Vital signs reviewed and stable  Post vital signs: Reviewed and stable  Last Vitals:  Vitals Value Taken Time  BP 129/97 08/09/23 1231  Temp    Pulse 86 08/09/23 1233  Resp 20 08/09/23 1233  SpO2 96 % 08/09/23 1233  Vitals shown include unfiled device data.  Last Pain:  Vitals:   08/09/23 1002  TempSrc:   PainSc: 0-No pain      Patients Stated Pain Goal: 3 (08/09/23 1002)  Complications: No notable events documented.

## 2023-08-09 NOTE — H&P (Signed)
Larry Sanders is an 37 y.o. male.    Chief Complaint: Pre-OP Circumcision  HPI:   1 - Subacute Paraphimosis - abtou a month of non-retracted foresking now with  skin breakdown and ulceration, seen in ER yesterday. No glans necrosis.  UNcircumcised, similar episode last year.   PMH sig for obesity. NO CV disease / blood thiners. 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 His PCP is Donnel Saxon MD with [facility].   Today " Larry Sanders " (pronounced bank-em) is seen to proceed with circumcision.   Past Medical History:  Diagnosis Date   Sleep apnea    No longer uses CPAP   Tobacco use    dips    History reviewed. No pertinent surgical history.  History reviewed. No pertinent family history. Social History:  reports that he has never smoked. His smokeless tobacco use includes chew. He reports current alcohol use of about 4.0 standard drinks of alcohol per week. He reports that he does not use drugs.  Allergies: No Known Allergies  No medications prior to admission.    Results for orders placed or performed during the hospital encounter of 08/07/23 (from the past 48 hour(s))  CBC with Differential     Status: Abnormal   Collection Time: 08/07/23  4:52 PM  Result Value Ref Range   WBC 8.4 4.0 - 10.5 K/uL   RBC 4.71 4.22 - 5.81 MIL/uL   Hemoglobin 15.0 13.0 - 17.0 g/dL   HCT 54.0 98.1 - 19.1 %   MCV 95.3 80.0 - 100.0 fL   MCH 31.8 26.0 - 34.0 pg   MCHC 33.4 30.0 - 36.0 g/dL   RDW 47.8 29.5 - 62.1 %   Platelets 412 (H) 150 - 400 K/uL   nRBC 0.0 0.0 - 0.2 %   Neutrophils Relative % 73 %   Neutro Abs 6.2 1.7 - 7.7 K/uL   Lymphocytes Relative 16 %   Lymphs Abs 1.4 0.7 - 4.0 K/uL   Monocytes Relative 8 %   Monocytes Absolute 0.7 0.1 - 1.0 K/uL   Eosinophils Relative 1 %   Eosinophils Absolute 0.1 0.0 - 0.5 K/uL   Basophils Relative 1 %   Basophils Absolute 0.1 0.0 - 0.1 K/uL   Immature Granulocytes 1 %   Abs Immature Granulocytes 0.04 0.00 - 0.07 K/uL     Comment: Performed at [facility], [address] [place], [place] [address]  Basic metabolic panel     Status: Abnormal   Collection Time: 08/07/23  4:52 PM  Result Value Ref Range   Sodium 133 (L) 135 - 145 mmol/L   Potassium 3.8 3.5 - 5.1 mmol/L   Chloride 99 98 - 111 mmol/L   CO2 22 22 - 32 mmol/L   Glucose, Bld 94 70 - 99 mg/dL    Comment: Glucose reference range applies only to samples taken after fasting for at least 8 hours.   BUN 8 6 - 20 mg/dL   Creatinine, Ser 7.84 0.61 - 1.24 mg/dL   Calcium 9.3 8.9 - 69.6 mg/dL   GFR, Estimated >29 >52 mL/min    Comment: (NOTE) Calculated using the CKD-EPI Creatinine Equation (2021)    Anion gap 12 5 - 15    Comment: Performed at [facility], [address] [place], [place] [address]  Lactic acid, plasma     Status: None   Collection Time: 08/07/23  4:52 PM  Result Value Ref Range  Lactic Acid, Venous 1.5 0.5 - 1.9 mmol/L    Comment: Performed at [facility], [address] [place], [place] [address]  Rapid HIV screen (HIV 1/2 Ab+Ag)     Status: None   Collection Time: 08/07/23  6:44 PM  Result Value Ref Range   HIV-1 P24 Antigen - HIV24 NON REACTIVE NON REACTIVE    Comment: (NOTE) Detection of p24 may be inhibited by biotin in the sample, causing false negative results in acute infection.    HIV 1/2 Antibodies NON REACTIVE NON REACTIVE   Interpretation (HIV Ag Ab)      A non reactive test result means that HIV 1 or HIV 2 antibodies and HIV 1 p24 antigen were not detected in the specimen.    Comment: Performed at [facility], [address] [place], [place] [address]  GC/Chlamydia probe amp [facility]) not at [facility]     Status: None   Collection Time: 08/07/23  6:44 PM  Result Value Ref Range   Neisseria Gonorrhea Negative    Chlamydia Negative    Comment Normal Reference Ranger Chlamydia - Negative    Comment      Normal Reference Range Neisseria Gonorrhea - Negative   RPR     Status: None   Collection Time: 08/07/23  6:55 PM  Result Value Ref Range   RPR Ser Ql NON REACTIVE NON REACTIVE    Comment: Performed at [facility], [address] [place], [place] [address]  Urinalysis, Routine w reflex microscopic -     Status: Abnormal   Collection Time: 08/07/23  7:10 PM  Result Value Ref Range   Color, Urine YELLOW YELLOW   APPearance CLEAR CLEAR   Specific Gravity, Urine 1.010 1.005 - 1.030   pH 5.5 5.0 - 8.0   Glucose, UA NEGATIVE NEGATIVE mg/dL   Hgb urine dipstick TRACE (A) NEGATIVE   Bilirubin Urine NEGATIVE NEGATIVE   Ketones, ur NEGATIVE NEGATIVE mg/dL   Protein, ur NEGATIVE NEGATIVE mg/dL   Nitrite NEGATIVE NEGATIVE   Leukocytes,Ua NEGATIVE NEGATIVE    Comment: Performed at [facility], [address] [place], [place] [address]  Urinalysis, Microscopic (reflex)     Status: Abnormal   Collection Time: 08/07/23  7:10 PM  Result Value Ref Range   RBC / HPF 0-5 0 - 5 RBC/hpf   WBC, UA NONE SEEN 0 - 5 WBC/hpf   Bacteria, UA RARE (A) NONE SEEN   Squamous Epithelial / HPF 0-5 0 - 5 /HPF    Comment: Performed at [facility], [address] [place], [place] [address]   CT PELVIS W CONTRAST  Result Date: 08/07/2023 CLINICAL DATA:  Focal lesion in the midline of the scrotum with ulceration, initial encounter EXAM: CT PELVIS WITH CONTRAST TECHNIQUE: Multidetector CT imaging of the pelvis was performed using the standard protocol following the bolus administration of intravenous contrast. RADIATION DOSE REDUCTION: This exam was performed according to the departmental dose-optimization program which includes automated exposure control, adjustment of the mA and/or kV according to patient size and/or use of iterative reconstruction technique. CONTRAST:  OMNIPAQUE IOHEXOL 300 MG/ML  SOLN COMPARISON:  None Available. FINDINGS: Urinary Tract:  Bladder is well distended. Bowel:  Visualized bowel shows no obstructive change.  Vascular/Lymphatic: No vascular abnormality is noted. Scattered small lymph nodes are noted likely reactive nature in the external iliac chain bilaterally. Reproductive: There is a focal fluid attenuation lesion  identified along the inferior aspect of the penis superior to the scrotum consistent with the given clinical history. This may represent a small focal abscess and measures 4.7 x 2.9 cm in greatest transverse and AP dimensions respectively. No other significant inflammatory changes are noted. Prostate is within normal limits. Other:  None. Musculoskeletal: No suspicious bone lesions identified. IMPRESSION: Fluid attenuation lesion along the inferior aspect of the penis suspicious for focal abscess. Some reactive adenopathy is noted in the iliac chain bilaterally. Electronically Signed   By: Alcide Clever M.D.   On: 08/07/2023 19:41    Review of Systems  Constitutional:  Negative for chills and fever.  All other systems reviewed and are negative.   Height 5\' 7"  (1.702 m), weight 122.5 kg. Physical Exam Vitals reviewed.  HENT:     Nose: Nose normal.  Eyes:     Pupils: Pupils are equal, round, and reactive to light.  Cardiovascular:     Rate and Rhythm: Normal rate.  Pulmonary:     Effort: Pulmonary effort is normal.  Abdominal:     General: Abdomen is flat.  Genitourinary:    Comments: Stable paraphimosis w/o glans necrosis. Stable penile shaft skin edema and superficial skin breakdown.  Musculoskeletal:        General: Normal range of motion.     Cervical back: Normal range of motion.  Skin:    General: Skin is warm.  Neurological:     General: No focal deficit present.     Mental Status: He is alert.  Psychiatric:        Mood and Affect: Mood normal.      Assessment/Plan  Proceed as planned with circumcision. Risks, benefits, alternatives, expected peri-op course discussed previously and reiterated today. He understands that his habitus with partially buried penis and  delayed presentation increases risk of complications such as wound breakdown and poor cosmesis.   Loletta Parish., MD 08/09/2023, 8:20 AM

## 2023-08-09 NOTE — Discharge Instructions (Signed)
1 - Remove dressing tomorrow morning. OK to shower after dressing removed. All stitches are dissolvable and will go away over about 3 weeks. No sexual stimulation x 2 weeks.   2 - Call MD or go to ER for fever >102, severe pain / nausea / vomiting not relieved by medications, or acute change in medical status

## 2023-08-09 NOTE — Op Note (Signed)
NAMESEDARIUS, POLEN MEDICAL RECORD NO: [id number] ACCOUNT NO: [id number] DATE OF BIRTH: January 18, 1986 FACILITY: [facility] LOCATION: [facility] PHYSICIAN: Sebastian Ache, MD  Operative Report   DATE OF PROCEDURE: 08/09/2023  PREOPERATIVE DIAGNOSIS:  Delayed paraphimosis.  PROCEDURE:  Circumcision.  ESTIMATED BLOOD LOSS:  Nil.  COMPLICATIONS:  None.  SPECIMEN:  Redundant edematous and phimotic foreskin for discard.  FINDINGS:   1.  Significant paraphimosis with redundant phimotic foreskin, was very edematous, but without any obvious glans necrosis. 2.  Complete resolution of paraphimosis and phimotic foreskin following circumcision. 3.  Baseline partially buried penis.  INDICATIONS:  The patient is a pleasant 37 year old man uncircumcised, who was found on workup of swollen penile area to have severe paraphimosis.  This is relatively subacute presentation as he has had this for ongoing 3 weeks with progressive worsening  of symptoms.  He was seen in the emergency facility several days ago and referred to our office yesterday where I saw him and examined him and recommended circumcision as soon as possible for permanent resolution as it was not otherwise reducible.  He  wished to proceed.  Informed consent was obtained and placed in medical record.  PROCEDURE IN DETAIL:  The patient being identified and verified.  Procedure being circumcision was confirmed.  Procedure timeout was performed.  Intravenous antibiotics were administered.  General anesthesia was induced.  The patient was placed into a  supine position.  Sterile field was created, prepped and draped the patient's penis, perineum, and proximal thighs using iodine after clipper shaving.  Penis was quite abnormal in appearance with significant paraphimosis.  Unfortunately, no evidence of  distal ischemia or glans necrosis.  There was significant edema of part of the preputial collar, especially ventrally. Edema was squeezed off to better  denote the true baseline anatomy and his penis is partially buried to baseline.  A plane was chosen  for proximal collar and taking exquisite care to take minimal amount of penile skin just necessary to reduce the phimosis and paraphimosis.  Distal collar was then developed and the redundant preputial tissue, which included the phimotic ring and the  severely edematous foreskin was released from underlying tissue using cautery dissection.  The length of the total collar was approximately 1.5 cm.  Again, taking exquisite care to avoid removal of excess skin.  The frenulum was trimmed to cosmesis and  reapproximated using U stitch Vicryl x3.  The 12 o'clock position was reapproximated using interrupted Vicryl as was the 6 o'clock position.  These were used as stay sutures and the skin was reapproximated using two separate running suture lines of 3-0  Vicryl from the 12 o'clock to 6 o'clock position respectively.  This resulted in excellent hemostasis and quite good cosmesis and I was very happy with the final result considering his severe preoperative state.  There was no evidence of any necrosis.  Area was cleaned and a dressing of Xeroform followed by a loose Kling and Coban was applied.  Procedure was terminated.  The patient tolerated the procedure well, no immediate perioperative complications.  The patient was taken to postanesthesia care  unit in stable condition.  Plan for discharge home.   PUS D: 08/09/2023 12:29:35 pm T: 08/09/2023 12:55:00 pm  JOB: [id number]/ [id number]

## 2023-08-10 ENCOUNTER — Encounter (HOSPITAL_COMMUNITY): Payer: Self-pay | Admitting: Urology

## 2023-08-10 ENCOUNTER — Encounter: Payer: Commercial Managed Care - HMO | Admitting: Urology

## 2023-08-11 NOTE — Anesthesia Postprocedure Evaluation (Signed)
Anesthesia Post Note  Patient: Larry Sanders  Procedure(s) Performed: CIRCUMCISION ADULT     Patient location during evaluation: PACU Anesthesia Type: General Level of consciousness: awake and alert Pain management: pain level controlled Vital Signs Assessment: post-procedure vital signs reviewed and stable Respiratory status: spontaneous breathing, nonlabored ventilation, respiratory function stable and patient connected to nasal cannula oxygen Cardiovascular status: blood pressure returned to baseline and stable Postop Assessment: no apparent nausea or vomiting Anesthetic complications: no  No notable events documented.  Last Vitals:  Vitals:   08/09/23 1300 08/09/23 1314  BP: 131/86 (!) 147/98  Pulse: 77 75  Resp: 19 18  Temp: 36.7 C 36.7 C  SpO2: 96% 100%    Last Pain:  Vitals:   08/09/23 1314  TempSrc: Oral  PainSc: 0-No pain                 Leili Eskenazi L Yamil Dougher

## 2023-08-18 ENCOUNTER — Other Ambulatory Visit (HOSPITAL_BASED_OUTPATIENT_CLINIC_OR_DEPARTMENT_OTHER): Payer: Self-pay
